# Patient Record
Sex: Female | Born: 1980 | Race: Black or African American | Hispanic: No | Marital: Married | Smoking: Never smoker
Health system: Southern US, Community
[De-identification: ages and names within clinical notes are randomized; demographics above are authoritative.]

## PROBLEM LIST (undated history)

## (undated) ENCOUNTER — Inpatient Hospital Stay (HOSPITAL_COMMUNITY): Payer: Self-pay

## (undated) DIAGNOSIS — Z789 Other specified health status: Secondary | ICD-10-CM

## (undated) DIAGNOSIS — R519 Headache, unspecified: Secondary | ICD-10-CM

## (undated) DIAGNOSIS — Z8619 Personal history of other infectious and parasitic diseases: Secondary | ICD-10-CM

## (undated) DIAGNOSIS — R51 Headache: Secondary | ICD-10-CM

## (undated) DIAGNOSIS — R42 Dizziness and giddiness: Secondary | ICD-10-CM

## (undated) DIAGNOSIS — R002 Palpitations: Secondary | ICD-10-CM

## (undated) DIAGNOSIS — D649 Anemia, unspecified: Secondary | ICD-10-CM

## (undated) HISTORY — DX: Personal history of other infectious and parasitic diseases: Z86.19

## (undated) HISTORY — DX: Dizziness and giddiness: R42

## (undated) HISTORY — PX: WISDOM TOOTH EXTRACTION: SHX21

## (undated) HISTORY — DX: Palpitations: R00.2

---

## 2008-03-11 ENCOUNTER — Ambulatory Visit (HOSPITAL_COMMUNITY): Admission: RE | Admit: 2008-03-11 | Discharge: 2008-03-11 | Payer: Self-pay | Admitting: Obstetrics and Gynecology

## 2008-04-01 ENCOUNTER — Ambulatory Visit (HOSPITAL_COMMUNITY): Admission: RE | Admit: 2008-04-01 | Discharge: 2008-04-01 | Payer: Self-pay | Admitting: Obstetrics and Gynecology

## 2008-05-27 ENCOUNTER — Ambulatory Visit (HOSPITAL_COMMUNITY): Admission: RE | Admit: 2008-05-27 | Discharge: 2008-05-27 | Payer: Self-pay | Admitting: Obstetrics and Gynecology

## 2008-06-17 ENCOUNTER — Ambulatory Visit (HOSPITAL_COMMUNITY): Admission: RE | Admit: 2008-06-17 | Discharge: 2008-06-17 | Payer: Self-pay | Admitting: Obstetrics and Gynecology

## 2008-06-21 ENCOUNTER — Inpatient Hospital Stay (HOSPITAL_COMMUNITY): Admission: AD | Admit: 2008-06-21 | Discharge: 2008-06-21 | Payer: Self-pay | Admitting: Obstetrics and Gynecology

## 2008-06-27 ENCOUNTER — Inpatient Hospital Stay (HOSPITAL_COMMUNITY): Admission: AD | Admit: 2008-06-27 | Discharge: 2008-06-29 | Payer: Self-pay | Admitting: Obstetrics and Gynecology

## 2008-07-22 ENCOUNTER — Ambulatory Visit (HOSPITAL_COMMUNITY): Admission: RE | Admit: 2008-07-22 | Discharge: 2008-07-22 | Payer: Self-pay | Admitting: Obstetrics and Gynecology

## 2008-08-12 ENCOUNTER — Inpatient Hospital Stay (HOSPITAL_COMMUNITY): Admission: RE | Admit: 2008-08-12 | Discharge: 2008-08-14 | Payer: Self-pay | Admitting: Obstetrics and Gynecology

## 2010-06-06 LAB — GC/CHLAMYDIA PROBE AMP, GENITAL
Chlamydia: NEGATIVE
Gonorrhea: NEGATIVE

## 2010-06-06 LAB — RPR: RPR: NONREACTIVE

## 2010-06-06 LAB — HIV ANTIBODY (ROUTINE TESTING W REFLEX): HIV: NONREACTIVE

## 2010-06-06 LAB — TSH: TSH: 0.075

## 2010-06-21 LAB — RUBELLA ANTIBODY, IGM: Rubella: IMMUNE

## 2010-08-05 IMAGING — US US OB FOLLOW-UP
1 series · 18 of 28 positions shown · non-contrast
Comparison: none

OBSTETRICAL ULTRASOUND:
 This ultrasound was performed in The [HOSPITAL], and the [facility] report will be stored to [REDACTED] PACS.

[Series 1: us ob follow-up · 64 acquisitions, 18 frames shown]
[im 1/64]
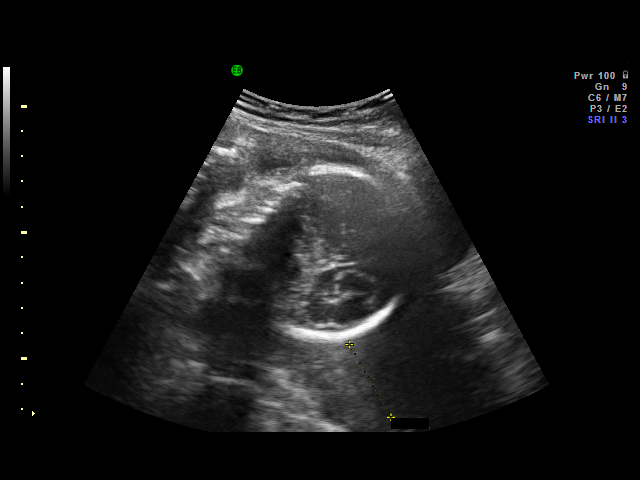
[im 5/64]
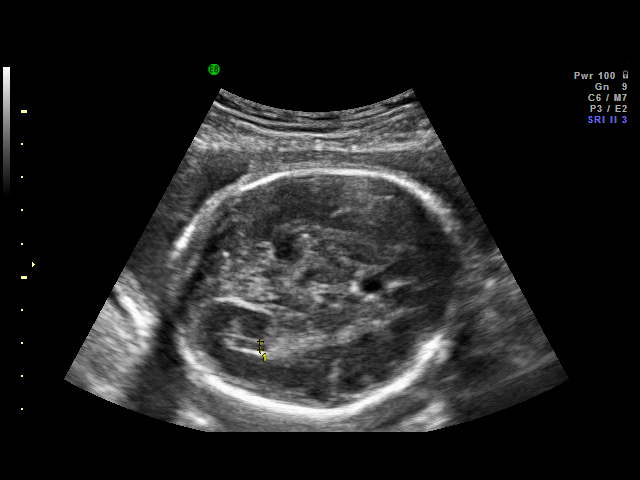
[im 8/64]
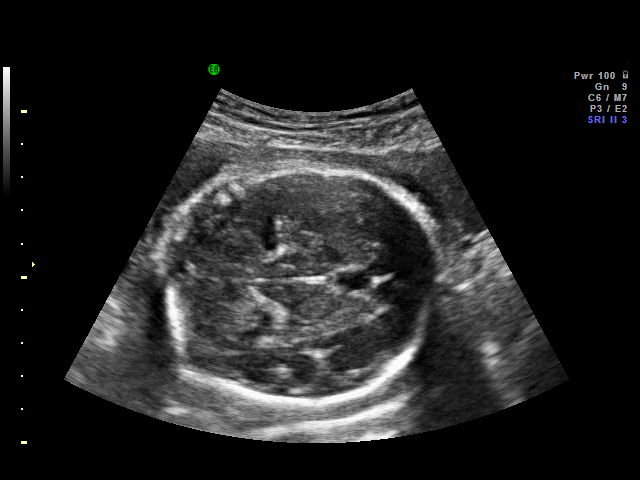
[im 12/64]
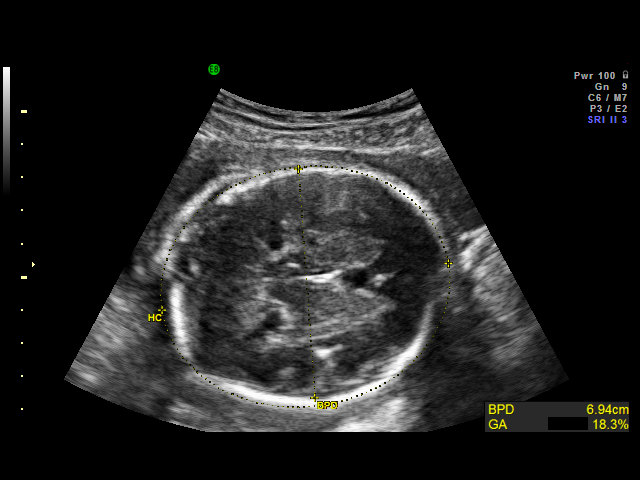
[im 17/64]
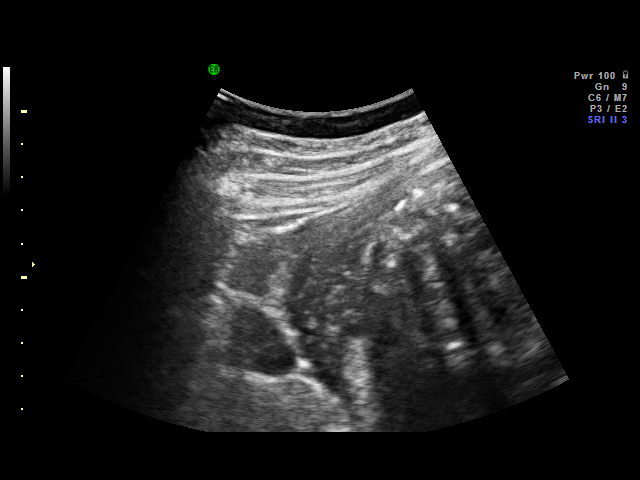
[im 19/64]
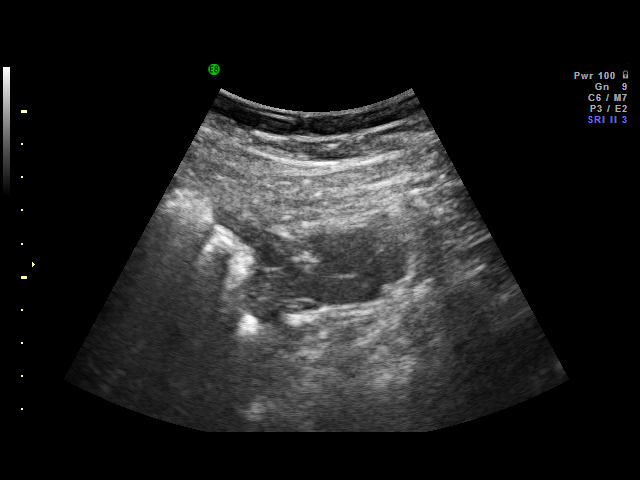
[im 24/64]
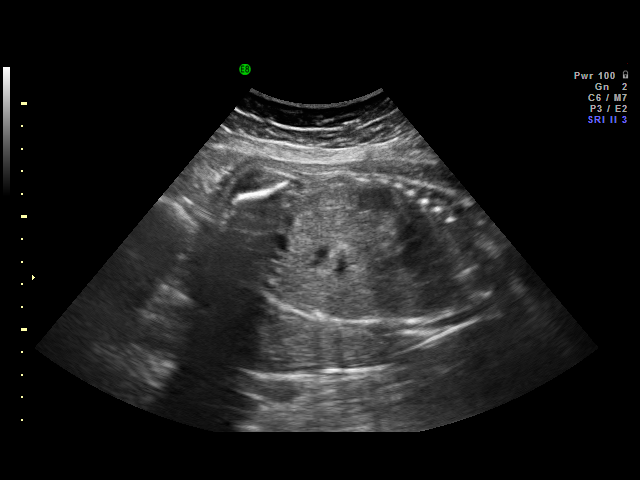
[im 26/64]
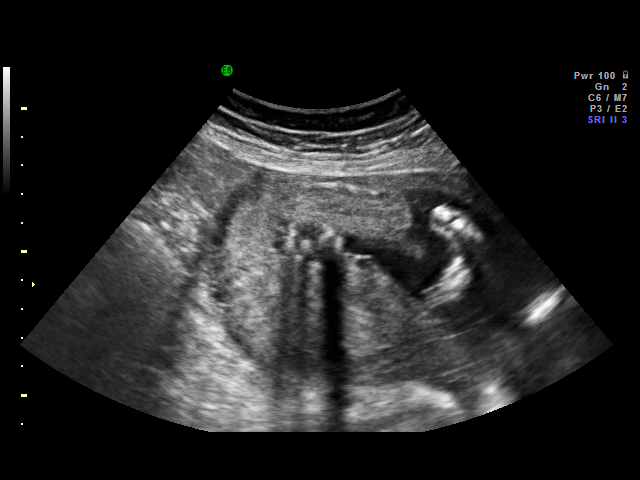
[im 31/64]
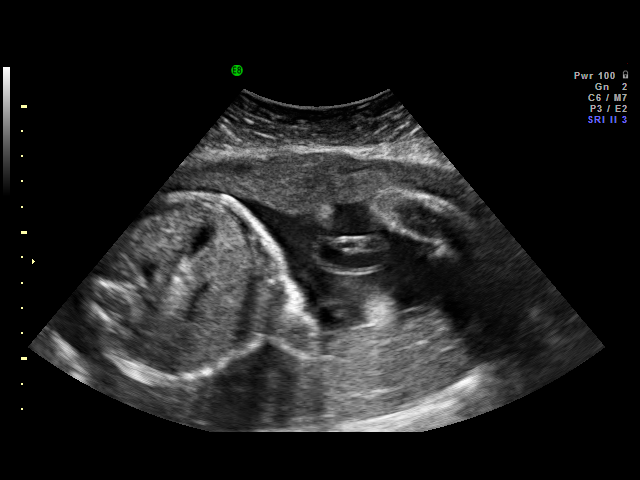
[im 33/64]
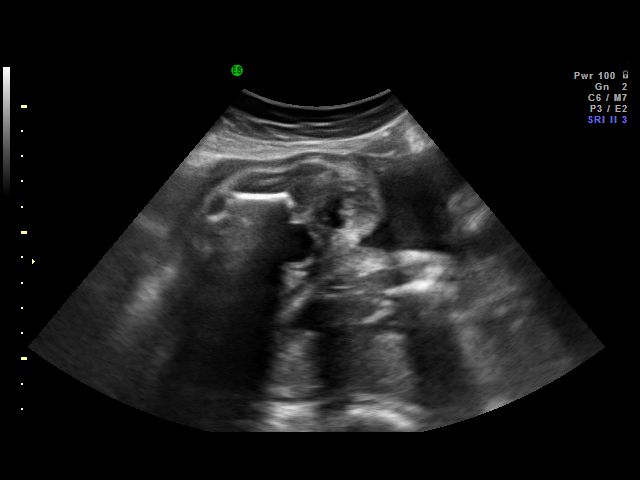
[im 38/64]
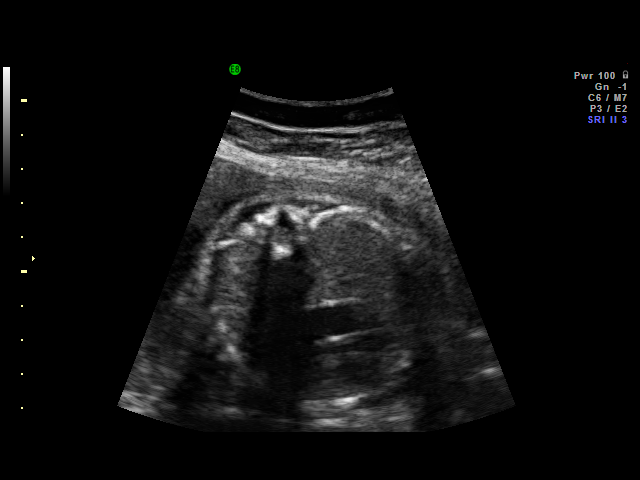
[im 40/64]
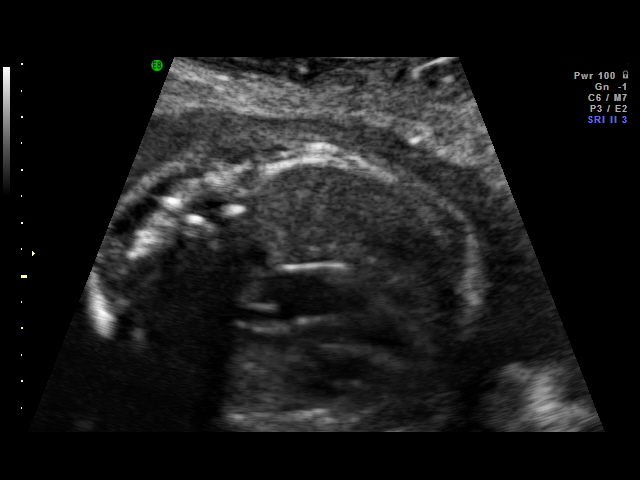
[im 45/64]
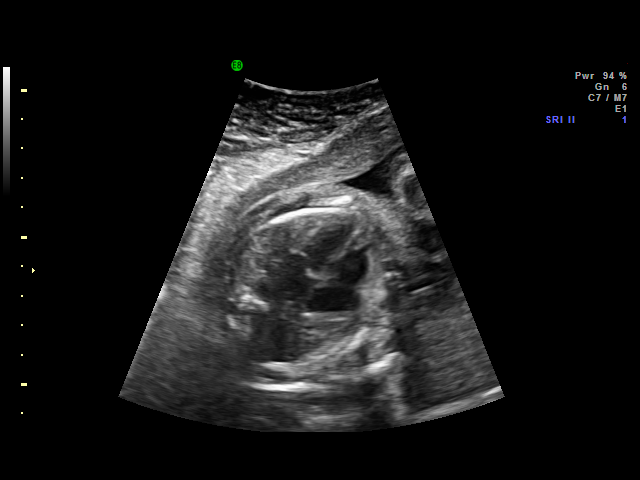
[im 50/64]
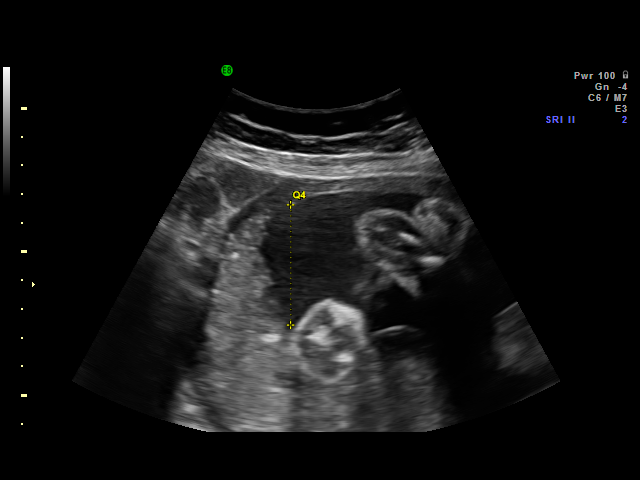
[im 52/64]
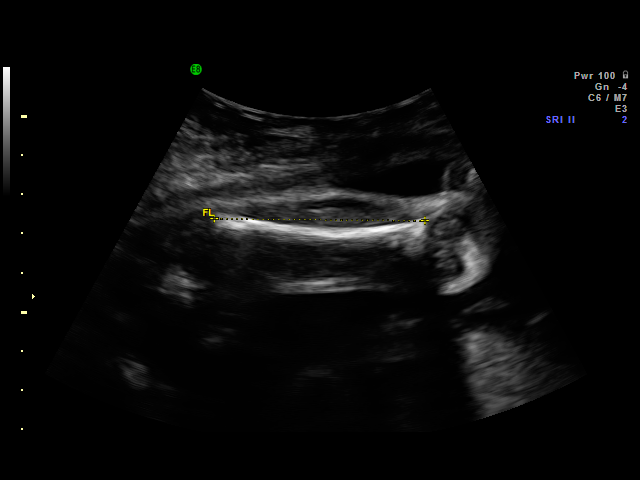
[im 57/64]
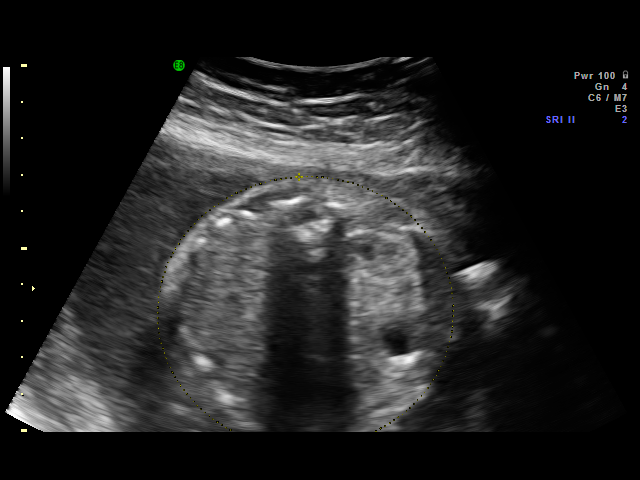
[im 59/64]
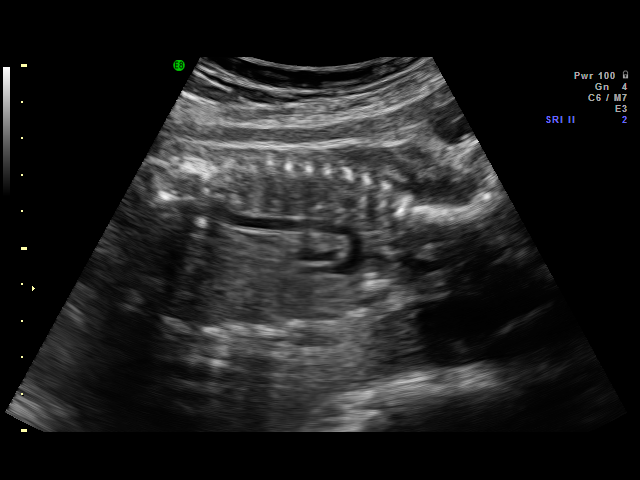
[im 64/64]
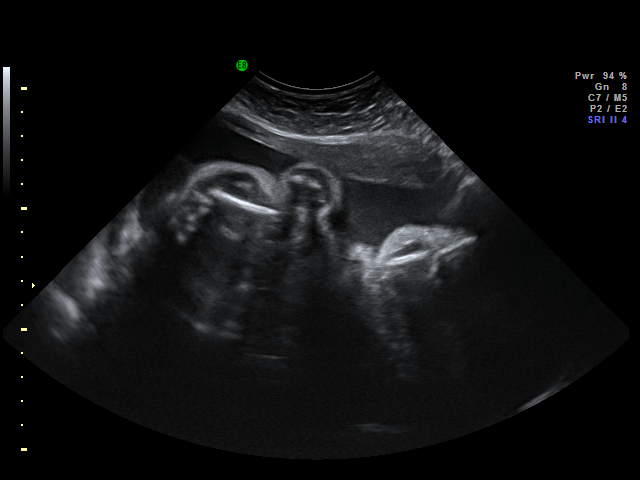

[18 of 28 positions shown; findings below may reference images not displayed]

IMPRESSION: [facility] has also been faxed to the ordering physician.

## 2010-08-08 LAB — CBC
HCT: 34 % — ABNORMAL LOW (ref 36.0–46.0)
HCT: 38.2 % (ref 36.0–46.0)
Hemoglobin: 11.6 g/dL — ABNORMAL LOW (ref 12.0–15.0)
Hemoglobin: 13.1 g/dL (ref 12.0–15.0)
MCHC: 34 g/dL (ref 30.0–36.0)
MCHC: 34.4 g/dL (ref 30.0–36.0)
MCV: 91.8 fL (ref 78.0–100.0)
MCV: 93.5 fL (ref 78.0–100.0)
Platelets: 182 10*3/uL (ref 150–400)
Platelets: 233 10*3/uL (ref 150–400)
RBC: 3.64 MIL/uL — ABNORMAL LOW (ref 3.87–5.11)
RBC: 4.16 MIL/uL (ref 3.87–5.11)
RDW: 12.9 % (ref 11.5–15.5)
RDW: 13.2 % (ref 11.5–15.5)
WBC: 10.2 10*3/uL (ref 4.0–10.5)
WBC: 8.9 10*3/uL (ref 4.0–10.5)

## 2010-08-08 LAB — RPR: RPR Ser Ql: NONREACTIVE

## 2010-08-09 LAB — CBC
HCT: 31.5 % — ABNORMAL LOW (ref 36.0–46.0)
HCT: 32.4 % — ABNORMAL LOW (ref 36.0–46.0)
Hemoglobin: 10.6 g/dL — ABNORMAL LOW (ref 12.0–15.0)
Hemoglobin: 10.8 g/dL — ABNORMAL LOW (ref 12.0–15.0)
MCHC: 33.2 g/dL (ref 30.0–36.0)
MCHC: 33.7 g/dL (ref 30.0–36.0)
MCV: 92 fL (ref 78.0–100.0)
MCV: 92.7 fL (ref 78.0–100.0)
Platelets: 192 10*3/uL (ref 150–400)
Platelets: 220 10*3/uL (ref 150–400)
RBC: 3.42 MIL/uL — ABNORMAL LOW (ref 3.87–5.11)
RBC: 3.49 MIL/uL — ABNORMAL LOW (ref 3.87–5.11)
RDW: 13 % (ref 11.5–15.5)
RDW: 13.3 % (ref 11.5–15.5)
WBC: 10.1 10*3/uL (ref 4.0–10.5)
WBC: 8 10*3/uL (ref 4.0–10.5)

## 2010-08-09 LAB — BASIC METABOLIC PANEL
BUN: 1 mg/dL — ABNORMAL LOW (ref 6–23)
CO2: 21 mEq/L (ref 19–32)
Calcium: 8.6 mg/dL (ref 8.4–10.5)
Chloride: 111 mEq/L (ref 96–112)
Creatinine, Ser: 0.43 mg/dL (ref 0.4–1.2)
GFR calc Af Amer: >60 mL/min (ref >60)
GFR calc non Af Amer: >60 mL/min (ref >60)
Glucose, Bld: 128 mg/dL — ABNORMAL HIGH (ref 70–99)
Potassium: 4.1 mEq/L (ref 3.5–5.1)
Sodium: 139 mEq/L (ref 135–145)

## 2010-08-09 LAB — TYPE AND SCREEN
ABO/RH(D): A POS
Antibody Screen: NEGATIVE

## 2010-08-09 LAB — URINE CULTURE
Colony Count: 2000
Special Requests: NEGATIVE

## 2010-08-09 LAB — COMPREHENSIVE METABOLIC PANEL
ALT: 16 U/L (ref 0–35)
AST: 22 U/L (ref 0–37)
Albumin: 2.3 g/dL — ABNORMAL LOW (ref 3.5–5.2)
Alkaline Phosphatase: 86 U/L (ref 39–117)
BUN: 1 mg/dL — ABNORMAL LOW (ref 6–23)
CO2: 25 mEq/L (ref 19–32)
Calcium: 8.4 mg/dL (ref 8.4–10.5)
Chloride: 106 mEq/L (ref 96–112)
Creatinine, Ser: 0.47 mg/dL (ref 0.4–1.2)
GFR calc Af Amer: >60 mL/min (ref >60)
GFR calc non Af Amer: >60 mL/min (ref >60)
Glucose, Bld: 108 mg/dL — ABNORMAL HIGH (ref 70–99)
Potassium: 2.5 mEq/L — CL (ref 3.5–5.1)
Sodium: 135 mEq/L (ref 135–145)
Total Bilirubin: 0.2 mg/dL — ABNORMAL LOW (ref 0.3–1.2)
Total Protein: 5.3 g/dL — ABNORMAL LOW (ref 6.0–8.3)

## 2010-08-09 LAB — WET PREP, GENITAL
Clue Cells Wet Prep HPF POC: NONE SEEN
Trich, Wet Prep: NONE SEEN
Yeast Wet Prep HPF POC: NONE SEEN

## 2010-08-09 LAB — URINALYSIS, ROUTINE W REFLEX MICROSCOPIC
Bilirubin Urine: NEGATIVE
Glucose, UA: 100 mg/dL — AB
Hgb urine dipstick: NEGATIVE
Ketones, ur: NEGATIVE mg/dL
Nitrite: NEGATIVE
Protein, ur: NEGATIVE mg/dL
Specific Gravity, Urine: 1.02 (ref 1.005–1.030)
Urobilinogen, UA: 0.2 mg/dL (ref 0.0–1.0)
pH: 6 (ref 5.0–8.0)

## 2010-08-09 LAB — MAGNESIUM: Magnesium: 5 mg/dL — ABNORMAL HIGH (ref 1.5–2.5)

## 2010-08-09 LAB — ABO/RH: ABO/RH(D): A POS

## 2010-08-09 LAB — POTASSIUM: Potassium: 3.5 mEq/L (ref 3.5–5.1)

## 2010-08-09 LAB — FETAL FIBRONECTIN: Fetal Fibronectin: POSITIVE

## 2010-08-14 LAB — COMPREHENSIVE METABOLIC PANEL
ALT: 11 U/L (ref 0–35)
AST: 15 U/L (ref 0–37)
Albumin: 2.8 g/dL — ABNORMAL LOW (ref 3.5–5.2)
Alkaline Phosphatase: 92 U/L (ref 39–117)
BUN: 6 mg/dL (ref 6–23)
CO2: 27 mEq/L (ref 19–32)
Calcium: 9.2 mg/dL (ref 8.4–10.5)
Chloride: 107 mEq/L (ref 96–112)
Creatinine, Ser: 0.44 mg/dL (ref 0.4–1.2)
GFR calc Af Amer: >60 mL/min (ref >60)
GFR calc non Af Amer: >60 mL/min (ref >60)
Glucose, Bld: 90 mg/dL (ref 70–99)
Potassium: 3.9 mEq/L (ref 3.5–5.1)
Sodium: 137 mEq/L (ref 135–145)
Total Bilirubin: 0.5 mg/dL (ref 0.3–1.2)
Total Protein: 6.3 g/dL (ref 6.0–8.3)

## 2010-08-14 LAB — CBC
HCT: 35.6 % — ABNORMAL LOW (ref 36.0–46.0)
Hemoglobin: 11.8 g/dL — ABNORMAL LOW (ref 12.0–15.0)
MCHC: 33.2 g/dL (ref 30.0–36.0)
MCV: 92.1 fL (ref 78.0–100.0)
Platelets: 221 10*3/uL (ref 150–400)
RBC: 3.86 MIL/uL — ABNORMAL LOW (ref 3.87–5.11)
RDW: 12.7 % (ref 11.5–15.5)
WBC: 8.9 10*3/uL (ref 4.0–10.5)

## 2010-08-14 LAB — URINALYSIS, ROUTINE W REFLEX MICROSCOPIC
Bilirubin Urine: NEGATIVE
Glucose, UA: NEGATIVE mg/dL
Hgb urine dipstick: NEGATIVE
Ketones, ur: NEGATIVE mg/dL
Nitrite: NEGATIVE
Protein, ur: NEGATIVE mg/dL
Specific Gravity, Urine: 1.015 (ref 1.005–1.030)
Urobilinogen, UA: 1 mg/dL (ref 0.0–1.0)
pH: 7 (ref 5.0–8.0)

## 2010-09-11 NOTE — Discharge Summary (Signed)
Jacqueline Salazar, Jacqueline Salazar             ACCOUNT NO.:  [id number]   MEDICAL RECORD NO.:  [id number]          PATIENT TYPE:  INP   LOCATION:  9151                          FACILITY:  [facility]   PHYSICIAN:  Pieter Partridge, MD   DATE OF BIRTH:  06-Nov-1980   DATE OF ADMISSION:  06/27/2008  DATE OF DISCHARGE:  06/29/2008                               DISCHARGE SUMMARY   ADMITTING DIAGNOSIS:  Pregnancy at 45 and 6/7th weeks with abdominal  pain.   DISCHARGE DIAGNOSIS:  Preterm labor and constipation.   PROCEDURES:  The patient received magnesium sulfate tocolysis and  betamethasone x2.   HISTORY OF PRESENT ILLNESS:  Jacqueline Salazar is a 30 year old G4, P2-0-1-2,  who presented at 56 and 6/7th weeks by the estimated due date of August 10, 2007, with the complaint of intermittent contractions.  She had  recently had gastroenteritis and was seen at [facility] the day  prior to admission.  She was told that she was contracting.  She  received 2 L of fluid at their Labor and Delivery Unit.  Her cervix was  closed when she was discharged from [place].  Upon arrival to the  office, the patient was complaining of contractions 4-6 an hour with the  baby pushing down.  Her cervical exam in the office was 1 cm thick and  high.   She was then sent over to MAU for monitoring and additional workup.  The  patient had a lot of irritability on the monitor but no contractions  were appreciated.  Her clinical presentation was consistent with labor,  she had significant pain.  She was breathing through her contractions, a  little diaphoretic, and complained of the baby pushing down.  An  ultrasound was done, which did not show an abruption.  A pelvic  ultrasound was done for wet prep and a fetal fibronectin.  Fetal  fibronectin was positive.  Wet prep was negative.  Ultrasound showed no  abruption.  Because the clinical picture and the cervical change from  close to 1 cm was noted, the patient was admitted for  observation.   I initially wanted to start Procardia; however, I saw the patient was  having contractions pretty regularly per history that magnesium sulfate  was necessary.  She was counseled on the side effects and the symptoms  of toxicity.   She got a 4-g IV load of magnesium, then 2 g every hour, betamethasone  injections were given and she was kept on bedrest.  On the date of  admission, she was able to sleep a little bit overnight with Ambien,  however, still was uncomfortable with contractions.   On hospital day #2, her contractions were down to 2 times an hour, with  decrease to 1.  Her contractions then increased when the magnesium was  increased to 2.5.  Her mag level after the initial low was 5.   Once, the second dose of betamethasone was in, the magnesium was  discontinued approximately 4-6 hours after that because she continued to  have contractions every 2-3 minutes and was quite comfortable.  At that  time, once the magnesium was discontinued, she was transferred to Labor  and Delivery for which we thought would be anticipated delivery.  The  patient did not deliver.  She continued to have contractions and  significant discomfort and pain but did not deliver.  The patient also  not had a bowel movement in 3-4 days.  She was given milk of magnesia  and Dulcolax p.o. with no relief.  On the day of discharge, her cervix  was checked and by my exam, she was 2 cm, 30%, -2; 12 hours previously,  the nurse had checked her and found her too thick and believe high.  So,  she had no cervical change.  She received a Dulcolax suppository and  after she had a bowel movement, she felt significantly better.  She was  not on any tocolytic overnight.  When the mag was discontinued on the  day of discharge, she was started on Procardia.  She responded very  well.  She did not have any other contractions.   Throughout her entire admission, her baby was very reassuring and  beautifully  reactive with prolonged accelerations.  She was given  clindamycin for GBS prophylaxis which was stopped on the day of  discharge.  CBC and CMP were done upon admission.  Her potassium was  low.  On admission, it was approximately 2.5.  She was given 40 mEq of K-  Dur q.6 h. x4 doses.  Her H and H at the time of discharge was, she was  still anemic; it was 10.8 hemoglobin and that was previously 10.6, WBCs  were 10.1.   DISCHARGE INFORMATION:  The patient was discharged home.  Activity is  bedrest and pelvic rest.  She will have TED hose to go home with.   MEDICATIONS:  She is to continue her prenatal vitamins and iron as  previously prescribed.  She was given a prescription for Procardia 10 mg  1 tablet p.o. q.6 h.  She was given preterm labor precautions.  Diet is  regular.  She has an appointment to follow up with [place] in 1 week.      Pieter Partridge, MD  Electronically Signed     EBV/MEDQ  D:  06/29/2008  T:  06/30/2008  Job:  [phone]

## 2010-10-04 LAB — CBC: Platelets: 251 10*3/uL (ref 150–399)

## 2010-10-04 LAB — HEPATITIS B SURFACE ANTIGEN: Hepatitis B Surface Ag: NEGATIVE

## 2010-10-04 LAB — HIV ANTIBODY (ROUTINE TESTING W REFLEX): HIV: NONREACTIVE

## 2010-12-05 LAB — STREP B DNA PROBE: GBS: NEGATIVE

## 2010-12-25 ENCOUNTER — Encounter (HOSPITAL_COMMUNITY): Payer: Self-pay | Admitting: *Deleted

## 2010-12-25 ENCOUNTER — Inpatient Hospital Stay (HOSPITAL_COMMUNITY)
Admission: AD | Admit: 2010-12-25 | Discharge: 2010-12-27 | DRG: 373 | Disposition: A | Discharge status: Home / Self Care | Payer: BC Managed Care – PPO | Source: Ambulatory Visit | Attending: Obstetrics and Gynecology | Admitting: Obstetrics and Gynecology

## 2010-12-25 HISTORY — DX: Other specified health status: Z78.9

## 2010-12-25 LAB — CBC
HCT: 34.9 % — ABNORMAL LOW (ref 36.0–46.0)
Hemoglobin: 12 g/dL (ref 12.0–15.0)
MCH: 30 pg (ref 26.0–34.0)
MCHC: 34.4 g/dL (ref 30.0–36.0)
MCV: 87.3 fL (ref 78.0–100.0)
Platelets: 233 10*3/uL (ref 150–400)
RBC: 4 MIL/uL (ref 3.87–5.11)
RDW: 12.8 % (ref 11.5–15.5)
WBC: 8.7 10*3/uL (ref 4.0–10.5)

## 2010-12-25 MED ORDER — LACTATED RINGERS IV SOLN: INTRAVENOUS | Status: DC

## 2010-12-25 MED ORDER — FLEET ENEMA 7-19 GM/118ML RE ENEM: 1.0000 | ENEMA | RECTAL | Status: DC | PRN

## 2010-12-25 MED ORDER — LIDOCAINE HCL (PF) 1 % IJ SOLN
30.0000 mL | INTRAMUSCULAR | Status: DC | PRN
  Filled 2010-12-25 (×2): qty 30

## 2010-12-25 MED ORDER — FENTANYL 2.5 MCG/ML BUPIVACAINE 1/10 % EPIDURAL INFUSION (WH - ANES)
14.0000 mL/h | INTRAMUSCULAR | Status: DC
  Administered 2010-12-26: 14 mL/h via EPIDURAL
  Filled 2010-12-25: qty 60

## 2010-12-25 MED ORDER — LACTATED RINGERS IV SOLN: 500.0000 mL | INTRAVENOUS | Status: DC | PRN

## 2010-12-25 MED ORDER — ACETAMINOPHEN 325 MG PO TABS: 650.0000 mg | ORAL_TABLET | ORAL | Status: DC | PRN

## 2010-12-25 MED ORDER — EPHEDRINE 5 MG/ML INJ
10.0000 mg | INTRAVENOUS | Status: DC | PRN
  Filled 2010-12-25: qty 4

## 2010-12-25 MED ORDER — CITRIC ACID-SODIUM CITRATE 334-500 MG/5ML PO SOLN: 30.0000 mL | ORAL | Status: DC | PRN

## 2010-12-25 MED ORDER — OXYTOCIN 20 UNITS IN LACTATED RINGERS INFUSION - SIMPLE: 125.0000 mL/h | INTRAVENOUS | Status: DC

## 2010-12-25 MED ORDER — FENTANYL 2.5 MCG/ML BUPIVACAINE 1/10 % EPIDURAL INFUSION (WH - ANES): 14.0000 mL/h | INTRAMUSCULAR | Status: DC

## 2010-12-25 MED ORDER — OXYCODONE-ACETAMINOPHEN 5-325 MG PO TABS: 2.0000 | ORAL_TABLET | ORAL | Status: DC | PRN

## 2010-12-25 MED ORDER — DIPHENHYDRAMINE HCL 50 MG/ML IJ SOLN: 12.5000 mg | INTRAMUSCULAR | Status: DC | PRN

## 2010-12-25 MED ORDER — LACTATED RINGERS IV SOLN
INTRAVENOUS | Status: DC
  Administered 2010-12-25: 125 mL/h via INTRAVENOUS

## 2010-12-25 MED ORDER — OXYTOCIN BOLUS FROM INFUSION
500.0000 mL | Freq: Once | INTRAVENOUS | Status: DC
  Filled 2010-12-25: qty 1000
  Filled 2010-12-25: qty 500

## 2010-12-25 MED ORDER — ONDANSETRON HCL 4 MG/2ML IJ SOLN: 4.0000 mg | Freq: Four times a day (QID) | INTRAMUSCULAR | Status: DC | PRN

## 2010-12-25 MED ORDER — OXYTOCIN BOLUS FROM INFUSION
500.0000 mL | Freq: Once | INTRAVENOUS | Status: DC
  Filled 2010-12-25: qty 500

## 2010-12-25 MED ORDER — IBUPROFEN 600 MG PO TABS: 600.0000 mg | ORAL_TABLET | Freq: Four times a day (QID) | ORAL | Status: DC | PRN

## 2010-12-25 MED ORDER — LIDOCAINE HCL (PF) 1 % IJ SOLN: 30.0000 mL | INTRAMUSCULAR | Status: DC | PRN

## 2010-12-25 MED ORDER — PHENYLEPHRINE 40 MCG/ML (10ML) SYRINGE FOR IV PUSH (FOR BLOOD PRESSURE SUPPORT)
80.0000 ug | PREFILLED_SYRINGE | INTRAVENOUS | Status: DC | PRN
  Filled 2010-12-25: qty 5

## 2010-12-25 MED ORDER — LACTATED RINGERS IV SOLN: 500.0000 mL | Freq: Once | INTRAVENOUS | Status: DC

## 2010-12-25 MED ORDER — EPHEDRINE 5 MG/ML INJ
10.0000 mg | INTRAVENOUS | Status: DC | PRN
  Filled 2010-12-25 (×2): qty 4

## 2010-12-25 MED ORDER — PHENYLEPHRINE 40 MCG/ML (10ML) SYRINGE FOR IV PUSH (FOR BLOOD PRESSURE SUPPORT): 80.0000 ug | PREFILLED_SYRINGE | INTRAVENOUS | Status: DC | PRN

## 2010-12-25 MED ORDER — PHENYLEPHRINE 40 MCG/ML (10ML) SYRINGE FOR IV PUSH (FOR BLOOD PRESSURE SUPPORT)
80.0000 ug | PREFILLED_SYRINGE | INTRAVENOUS | Status: DC | PRN
  Filled 2010-12-25 (×2): qty 5

## 2010-12-25 NOTE — Progress Notes (Signed)
Pt states ctx's began at 1445, no bleeding or lof, +FM, was 4cm/80% in office yesterday, was feeling a lot of rectal pressure, MD called, pt told to come to MAU for eval.

## 2010-12-25 NOTE — H&P (Signed)
Jacqueline Salazar is a 34 year all gravida 5 para 3 A1 female whose due date is 01/04/2011. She presents to MA U for regular contractions.  She denies rupture of membranes.Contractions are  every 2-4 minutes. She has had irregular contractions for the previous 2 days.   Past obstetric history is remarkable for 3 vaginal deliveries one miscarriage.   She is 38+ weeks pregnant on exam in the office on August 27 cervix was 480 and -1 membranes intact.   She is allergic to ampicillin She has no medical illnesses  No history of surgery  Review of systems is negative  Physical exam  vital signs are stable  HEENT exam is normal  S1 and S2 with clear Lungs are clear Abdomen bowel sounds present Term gravida Extremities normal without swelling discoloration Cervix 4-5, 80% effaced and -2 station, membranes intact  Assessment:  38+ week pregnancy, rule out labor  Plan:   expectant care Recheck cervix in 1-2 hours.

## 2010-12-25 NOTE — Progress Notes (Signed)
Pt states, " I started having contractions at 2:45 pm, and they are now they are every 5-8 mins. I was 4/80 in the office yesterday by Dr. Neva Seat."

## 2010-12-25 NOTE — ED Notes (Signed)
Reactive EFM tracing reviewed by JSpurlock-Frizzell, RN

## 2010-12-26 ENCOUNTER — Encounter (HOSPITAL_COMMUNITY): Payer: Self-pay | Admitting: Anesthesiology

## 2010-12-26 ENCOUNTER — Encounter (HOSPITAL_COMMUNITY): Payer: Self-pay | Admitting: *Deleted

## 2010-12-26 ENCOUNTER — Inpatient Hospital Stay (HOSPITAL_COMMUNITY): Payer: BC Managed Care – PPO | Admitting: Anesthesiology

## 2010-12-26 LAB — CBC
HCT: 36.3 % (ref 36.0–46.0)
Hemoglobin: 12 g/dL (ref 12.0–15.0)
MCH: 29.1 pg (ref 26.0–34.0)
MCHC: 33.1 g/dL (ref 30.0–36.0)
MCV: 88.1 fL (ref 78.0–100.0)
Platelets: 220 10*3/uL (ref 150–400)
RBC: 4.12 MIL/uL (ref 3.87–5.11)
RDW: 12.7 % (ref 11.5–15.5)
WBC: 12.7 10*3/uL — ABNORMAL HIGH (ref 4.0–10.5)

## 2010-12-26 LAB — COMPREHENSIVE METABOLIC PANEL
ALT: 6 U/L (ref 0–35)
AST: 16 U/L (ref 0–37)
Albumin: 2.6 g/dL — ABNORMAL LOW (ref 3.5–5.2)
Alkaline Phosphatase: 160 U/L — ABNORMAL HIGH (ref 39–117)
BUN: 3 mg/dL — ABNORMAL LOW (ref 6–23)
CO2: 26 mEq/L (ref 19–32)
Calcium: 9.6 mg/dL (ref 8.4–10.5)
Chloride: 105 mEq/L (ref 96–112)
Creatinine, Ser: <0.47 mg/dL — ABNORMAL LOW (ref 0.50–1.10)
Glucose, Bld: 88 mg/dL (ref 70–99)
Potassium: 4 mEq/L (ref 3.5–5.1)
Sodium: 136 mEq/L (ref 135–145)
Total Bilirubin: 0.8 mg/dL (ref 0.3–1.2)
Total Protein: 6.2 g/dL (ref 6.0–8.3)

## 2010-12-26 LAB — ABO/RH: RH Type: POSITIVE

## 2010-12-26 LAB — URIC ACID: Uric Acid, Serum: 5 mg/dL (ref 2.4–7.0)

## 2010-12-26 LAB — RPR: RPR Ser Ql: NONREACTIVE

## 2010-12-26 MED ORDER — DIBUCAINE 1 % RE OINT: 1.0000 "application " | TOPICAL_OINTMENT | RECTAL | Status: DC | PRN

## 2010-12-26 MED ORDER — ONDANSETRON HCL 4 MG/2ML IJ SOLN: 4.0000 mg | INTRAMUSCULAR | Status: DC | PRN

## 2010-12-26 MED ORDER — IBUPROFEN 600 MG PO TABS
600.0000 mg | ORAL_TABLET | Freq: Four times a day (QID) | ORAL | Status: DC
  Administered 2010-12-26 – 2010-12-27 (×5): 600 mg via ORAL
  Filled 2010-12-26 (×6): qty 1

## 2010-12-26 MED ORDER — SIMETHICONE 80 MG PO CHEW: 80.0000 mg | CHEWABLE_TABLET | ORAL | Status: DC | PRN

## 2010-12-26 MED ORDER — TETANUS-DIPHTH-ACELL PERTUSSIS 5-2.5-18.5 LF-MCG/0.5 IM SUSP
0.5000 mL | Freq: Once | INTRAMUSCULAR | Status: AC
  Administered 2010-12-27: 0.5 mL via INTRAMUSCULAR
  Filled 2010-12-26: qty 0.5

## 2010-12-26 MED ORDER — BENZOCAINE-MENTHOL 20-0.5 % EX AERO
1.0000 "application " | INHALATION_SPRAY | CUTANEOUS | Status: DC | PRN
  Administered 2010-12-26: 1 via TOPICAL

## 2010-12-26 MED ORDER — LANOLIN HYDROUS EX OINT: TOPICAL_OINTMENT | CUTANEOUS | Status: DC | PRN

## 2010-12-26 MED ORDER — TERBUTALINE SULFATE 1 MG/ML IJ SOLN: 0.2500 mg | Freq: Once | INTRAMUSCULAR | Status: DC | PRN

## 2010-12-26 MED ORDER — LIDOCAINE HCL 1.5 % IJ SOLN
INTRAMUSCULAR | Status: DC | PRN
  Administered 2010-12-26: 2 mL via EPIDURAL
  Administered 2010-12-26 (×2): 5 mL via EPIDURAL

## 2010-12-26 MED ORDER — PRENATAL PLUS 27-1 MG PO TABS
1.0000 | ORAL_TABLET | Freq: Every day | ORAL | Status: DC
  Administered 2010-12-26 – 2010-12-27 (×2): 1 via ORAL
  Filled 2010-12-26 (×2): qty 1

## 2010-12-26 MED ORDER — ZOLPIDEM TARTRATE 5 MG PO TABS: 5.0000 mg | ORAL_TABLET | Freq: Every evening | ORAL | Status: DC | PRN

## 2010-12-26 MED ORDER — MEDROXYPROGESTERONE ACETATE 150 MG/ML IM SUSP: 150.0000 mg | INTRAMUSCULAR | Status: DC | PRN

## 2010-12-26 MED ORDER — OXYTOCIN 20 UNITS IN LACTATED RINGERS INFUSION - SIMPLE
1.0000 m[IU]/min | INTRAVENOUS | Status: DC
  Administered 2010-12-26: 2 m[IU]/min via INTRAVENOUS
  Filled 2010-12-26: qty 1000

## 2010-12-26 MED ORDER — DIPHENHYDRAMINE HCL 25 MG PO CAPS: 25.0000 mg | ORAL_CAPSULE | Freq: Four times a day (QID) | ORAL | Status: DC | PRN

## 2010-12-26 MED ORDER — BENZOCAINE-MENTHOL 20-0.5 % EX AERO
INHALATION_SPRAY | CUTANEOUS | Status: AC
  Filled 2010-12-26: qty 56

## 2010-12-26 MED ORDER — OXYCODONE-ACETAMINOPHEN 5-325 MG PO TABS: 1.0000 | ORAL_TABLET | ORAL | Status: DC | PRN

## 2010-12-26 MED ORDER — MEASLES, MUMPS & RUBELLA VAC ~~LOC~~ INJ: 0.5000 mL | INJECTION | Freq: Once | SUBCUTANEOUS | Status: DC

## 2010-12-26 MED ORDER — CARBOPROST TROMETHAMINE 250 MCG/ML IM SOLN: 250.0000 ug | INTRAMUSCULAR | Status: DC | PRN

## 2010-12-26 MED ORDER — ONDANSETRON HCL 4 MG PO TABS: 4.0000 mg | ORAL_TABLET | ORAL | Status: DC | PRN

## 2010-12-26 MED ORDER — SENNOSIDES-DOCUSATE SODIUM 8.6-50 MG PO TABS
2.0000 | ORAL_TABLET | Freq: Every day | ORAL | Status: DC
  Administered 2010-12-26: 2 via ORAL

## 2010-12-26 MED ORDER — WITCH HAZEL-GLYCERIN EX PADS: 1.0000 "application " | MEDICATED_PAD | CUTANEOUS | Status: DC | PRN

## 2010-12-26 NOTE — Anesthesia Procedure Notes (Signed)
Epidural Patient location during procedure: OB Start time: 12/26/2010 4:45 AM Reason for block: procedure for pain  Staffing Performed by: anesthesiologist   Preanesthetic Checklist Completed: patient identified, site marked, surgical consent, pre-op evaluation, timeout performed, IV checked, risks and benefits discussed and monitors and equipment checked  Epidural Patient position: sitting Prep: site prepped and draped and DuraPrep Patient monitoring: continuous pulse ox and blood pressure Approach: midline Injection technique: LOR air  Needle:  Needle type: Tuohy  Needle gauge: 17 G Needle length: 9 cm Needle insertion depth: 5 cm cm Catheter type: closed end flexible Catheter size: 19 Gauge Catheter at skin depth: 10 cm Test dose: negative  Assessment Events: blood not aspirated, injection not painful, no injection resistance, negative IV test and no paresthesia

## 2010-12-26 NOTE — Anesthesia Preprocedure Evaluation (Signed)
Anesthesia Evaluation  Name, MR# and DOB Patient awake  General Assessment Comment  Reviewed: Allergy & Precautions, H&P , NPO status , Patient's Chart, lab work & pertinent test results, reviewed documented beta blocker date and time   History of Anesthesia Complications Negative for: history of anesthetic complications  Airway Mallampati: III TM Distance: >3 FB Neck ROM: full    Dental  (+) Teeth Intact   Pulmonary  clear to auscultation  breath sounds clear to auscultation none    Cardiovascular regular Normal    Neuro/Psych Negative Neurological ROS  Negative Psych ROS  GI/Hepatic/Renal negative GI ROS  negative Liver ROS  negative Renal ROS        Endo/Other  Negative Endocrine ROS (+)      Abdominal   Musculoskeletal   Hematology negative hematology ROS (+)   Peds  Reproductive/Obstetrics (+) Pregnancy    Anesthesia Other Findings             Anesthesia Physical Anesthesia Plan  ASA: II  Anesthesia Plan: Epidural   Post-op Pain Management:    Induction:   Airway Management Planned:   Additional Equipment:   Intra-op Plan:   Post-operative Plan:   Informed Consent: I have reviewed the patients History and Physical, chart, labs and discussed the procedure including the risks, benefits and alternatives for the proposed anesthesia with the patient or authorized representative who has indicated his/her understanding and acceptance.     Plan Discussed with:   Anesthesia Plan Comments:         Anesthesia Quick Evaluation

## 2010-12-26 NOTE — Progress Notes (Signed)
Delivery Note At  a viable and healthyfemale was delivered via NSVD.  (Presentation: vtx, LOA ;  ).  APGAR:9.9 , ; weight .   Placenta status: spontaneously dedliverd, 3 vessel cord no complications: .   Anesthesia:   Episiotomy: no Lacerations: none Suture Repair:  na Est. Blood Loss (mL): 500cc  Mom to postpartum.  Baby to nursery-stable.  Pippa Hanif E 12/26/2010, 5:27 AM

## 2010-12-26 NOTE — Progress Notes (Signed)
Jacqueline Salazar is a 30 y.o. [phone] at [redacted]w[redacted]d by ultrasound admitted for active labor  Subjective:  No complaints   Objective: BP 124/87  Pulse 82  Temp(Src) 98 F (36.7 C) (Oral)  Resp 20  Ht 5\' 3"  (1.6 m)  Wt 71.668 kg (158 lb)  BMI 27.99 kg/m2      FHT:  FHR: 150s bpm, variability: moderate,  accelerations:  Present,  decelerations:  Absent UC:   regular, every 4 minutes SVE:   Dilation: 6.5 Effacement (%): 80 Station: -2 Exam by:: Dr. Neva Seat  Labs: Lab Results  Component Value Date   WBC 8.7 12/25/2010   HGB 12.0 12/25/2010   HCT 34.9* 12/25/2010   MCV 87.3 12/25/2010   PLT 233 12/25/2010    Assessment / Plan: Augmentation of labor, progressing well  Labor: Progressing with pitocin augmerntation Fetal Wellbeing:  Category I Pain Control:  Labor support without medications Anticipated MOD:  NSVD  Jacqueline Salazar E 12/26/2010, 4:48 AM

## 2010-12-27 NOTE — Anesthesia Postprocedure Evaluation (Signed)
Anesthesia Post Note  Patient: Jacqueline Salazar  Procedure(s) Performed: CLE  Anesthesia type: Epidural  Patient location: Mother/Baby  Post pain: Pain level controlled  Post assessment: Post-op Vital signs reviewed and Patient's Cardiovascular Status Stable  Last Vitals:  Filed Vitals:   12/27/10 0548  BP: 102/68  Pulse: 82  Temp: 97.7 F (36.5 C)  Resp: 18    Post vital signs: Reviewed and stable  Level of consciousness: awake, alert  and oriented  Complications: No apparent anesthesia complications

## 2010-12-27 NOTE — Discharge Summary (Signed)
Obstetric Discharge Summary Reason for Admission: onset of labor Prenatal Procedures: none Intrapartum Procedures: spontaneous vaginal delivery Postpartum Procedures: none Complications-Operative and Postpartum: none Hemoglobin  Date Value Range Status  12/26/2010 12.0  12.0-15.0 (g/dL) Final     HCT  Date Value Range Status  12/26/2010 36.3  36.0-46.0 (%) Final    Discharge Diagnoses: Term Pregnancy-delivered  Discharge Information: Date: 12/27/2010 Activity: pelvic rest Diet: routine Medications: None Condition: stable Instructions: refer to practice specific booklet Discharge to: home   Newborn Data: Live born female  Birth Weight: 6 lb 4.2 oz (2840 g) APGAR: , 9  Home with mother.  Iyesha Such E 12/27/2010, 2:51 PM

## 2010-12-27 NOTE — Progress Notes (Signed)
Post Partum Day 1.5 Subjective: no complaints  Objective: Blood pressure 114/73, pulse 90, temperature 98.1 F (36.7 C), temperature source Oral, resp. rate 20, height 5\' 3"  (1.6 m), weight 71.668 kg (158 lb), unknown if currently breastfeeding.  Physical Exam:  General: alert Lochia: appropriate Uterine Fundus: firm Episiotomy, laceration : healing well DVT Evaluation: No evidence of DVT seen on physical exam.   Basename 12/26/10 0920 12/25/10 2040  HGB 12.0 12.0  HCT 36.3 34.9*    Assessment/Plan: Discharge home   LOS: 2 days   Jianna Drabik E 12/27/2010, 2:54 PM

## 2014-02-12 LAB — OB RESULTS CONSOLE GC/CHLAMYDIA
Chlamydia: NEGATIVE
Gonorrhea: NEGATIVE

## 2014-02-12 LAB — OB RESULTS CONSOLE ANTIBODY SCREEN: Antibody Screen: NEGATIVE

## 2014-02-12 LAB — OB RESULTS CONSOLE RUBELLA ANTIBODY, IGM: Rubella: IMMUNE

## 2014-02-12 LAB — OB RESULTS CONSOLE HEPATITIS B SURFACE ANTIGEN: Hepatitis B Surface Ag: NEGATIVE

## 2014-02-12 LAB — OB RESULTS CONSOLE ABO/RH: RH Type: POSITIVE

## 2014-02-12 LAB — OB RESULTS CONSOLE RPR: RPR: NONREACTIVE

## 2014-02-12 LAB — OB RESULTS CONSOLE HIV ANTIBODY (ROUTINE TESTING): HIV: NONREACTIVE

## 2014-02-23 ENCOUNTER — Other Ambulatory Visit (HOSPITAL_COMMUNITY)
Admission: RE | Admit: 2014-02-23 | Discharge: 2014-02-23 | Disposition: A | Discharge status: Home / Self Care | Payer: BC Managed Care – PPO | Source: Ambulatory Visit | Attending: Obstetrics and Gynecology | Admitting: Obstetrics and Gynecology

## 2014-02-23 ENCOUNTER — Other Ambulatory Visit: Payer: Self-pay | Admitting: Obstetrics and Gynecology

## 2014-02-23 DIAGNOSIS — Z1151 Encounter for screening for human papillomavirus (HPV): Secondary | ICD-10-CM | POA: Diagnosis present

## 2014-02-23 DIAGNOSIS — Z01419 Encounter for gynecological examination (general) (routine) without abnormal findings: Secondary | ICD-10-CM | POA: Diagnosis not present

## 2014-02-24 LAB — CYTOLOGY - PAP

## 2014-02-28 ENCOUNTER — Encounter (HOSPITAL_COMMUNITY): Payer: Self-pay | Admitting: *Deleted

## 2014-04-29 NOTE — L&D Delivery Note (Signed)
Delivery Note At 3:40 PM a viable female was delivered via Vaginal, Spontaneous Delivery (Presentation: ; Occiput Anterior).  APGAR: 8, 9; weight pending  .   Placenta status: Intact, Spontaneous.  Cord: 3 vessels with the following complications: None.  Cord pH: n/a  Anesthesia: Epidural  Episiotomy: None Lacerations: None Suture Repair: n/a Est. Blood Loss (mL): 15  Mom to postpartum.  Baby to Couplet care / Skin to Skin.  Couple desires outpatient circumcision.  Jacqueline Salazar, Jacqueline Salazar 09/08/2014, 4:37 PM

## 2014-05-19 ENCOUNTER — Encounter: Payer: Self-pay | Admitting: Internal Medicine

## 2014-05-19 ENCOUNTER — Ambulatory Visit (INDEPENDENT_AMBULATORY_CARE_PROVIDER_SITE_OTHER): Payer: BLUE CROSS/BLUE SHIELD | Admitting: Internal Medicine

## 2014-05-19 VITALS — BP 124/82 | HR 109 | Ht 63.0 in | Wt 162.0 lb

## 2014-05-19 DIAGNOSIS — R519 Headache, unspecified: Secondary | ICD-10-CM

## 2014-05-19 DIAGNOSIS — R002 Palpitations: Secondary | ICD-10-CM

## 2014-05-19 DIAGNOSIS — R51 Headache: Secondary | ICD-10-CM

## 2014-05-19 NOTE — Patient Instructions (Signed)
Your physician has recommended that you wear a 48 hour holter monitor. 48 hour Holter monitors are medical devices that record the heart's electrical activity. Doctors most often use these monitors to diagnose arrhythmias. Arrhythmias are problems with the speed or rhythm of the heartbeat. The monitor is a small, portable device. You can wear one while you do your normal daily activities. This is usually used to diagnose what is causing palpitations/syncope (passing out).  Your physician recommends that you schedule a follow-up appointment in: 3 weeks with Dr. Graciela HusbandsKlein. Per Dr. Graciela HusbandsKlein.

## 2014-05-19 NOTE — Progress Notes (Addendum)
Cardiology Office Note   Date:  05/19/2014   ID:  Jacqueline Salazar, DOB 08-Dec-1980, MRN [id number]  PCP:  No primary care provider on file.  Cardiologist:  Dr. Sherryl MangesSteven Klein ([facility])    Chief Complaint  Patient presents with  . Palpitations     History of Present Illness: Jacqueline Salazar is a 34 y.o. female who presents for evaluation of the above.   The patient is G5P4.  She is currently [redacted] weeks gestation.  She is referred by her gynecologist (Dr. Dion BodyVarnado).  Over the last several days she has been having unusual HAs as well as dizziness with assoc palpitations.  She mainly has symptoms of dizziness after eating.  She typically notes a spinning sensation.  She denies nausea.  She does experience diaphoresis.  She denies syncope.  She can elicit symptoms if she moves in a certain direction.  She also notes dizziness upon standing.  palpitations start suddenly.  HR feels very fast and they stop suddenly.  She denies chest pain.  She denies orthopnea, PND, edema.  Her pregnancy has been uncomplicated.  Her BP is borderline elevated today.  She started to notice higher BPs this past weekend.  TSH obtained by her OB last month was normal.     Studies/Reports Reviewed Today: None    Past Medical History  Diagnosis Date  . No pertinent past medical history   . Palpitations   . Light headedness     Past Surgical History  Procedure Laterality Date  . Wisdom tooth extraction       Current Outpatient Prescriptions  Medication Sig Dispense Refill  . acetaminophen (TYLENOL) 500 MG tablet Take 500-1,000 mg by mouth every 6 (six) hours as needed. For headache or pain     . ferrous sulfate 325 (65 FE) MG tablet Take 325 mg by mouth 2 (two) times daily.      . prenatal vitamin w/FE, FA (PRENATAL 1 + 1) 27-1 MG TABS Take 1 tablet by mouth daily.       No current facility-administered medications for this visit.    Allergies:   Ampicillin    Social History:  The patient  reports that  she has never smoked. She has never used smokeless tobacco. She reports that she does not drink alcohol or use illicit drugs.   Family History:  The patient's family history includes Asthma in her daughter; Diabetes in her maternal aunt; Hypertension in her mother; Stroke in her maternal grandfather. There is no history of Heart attack, Heart disease, or Heart failure.    ROS:  Please see the history of present illness.   Otherwise, review of systems are positive for none.   All other systems are reviewed and negative.    PHYSICAL EXAM: VS:  BP 124/82 mmHg  Pulse 109  Ht 5\' 3"  (1.6 m)  Wt 162 lb (73.483 kg)  BMI 28.70 kg/m2     Orthostatic VS for the past 24 hrs:  BP- Lying Pulse- Lying BP- Sitting Pulse- Sitting BP- Standing at 0 minutes Pulse- Standing at 0 minutes  05/19/14 1640 122/79 mmHg 95 132/82 mmHg 96 128/81 mmHg 97    Wt Readings from Last 3 Encounters:  12/25/10 158 lb (71.668 kg)     GEN: Well nourished, well developed, in no acute distress HEENT: normal Neck: no JVD,  No carotid bruits, no masses Cardiac:  Normal S1/S2,  RRR; no murmur  ,   no rubs or gallops, no edema  Respiratory:  clear to auscultation bilaterally, no wheezing, rhonchi or rales. GI: gravid abdomen MS: no deformity or atrophy Skin: warm and dry  Neuro:  CNs II-XII intact, Strength and sensation are intact Psych: Normal affect   EKG:  EKG is ordered today.  It demonstrates:   NSR, HR 92, normal axis, QTc 413 ms, no ST changes   Recent Labs: No results found for requested labs within last 365 days.  02/11/14:  Free T4 1.30 04/19/14:  TSH 0.74   Lipid Panel No results found for: CHOL, TRIG, HDL, CHOLHDL, VLDL, LDLCALC, LDLDIRECT    ASSESSMENT AND PLAN:  1.  Palpitations:  Patient also interviewed and examined by Dr. Sherryl Manges.  She has symptoms that sound c/w PSVT.      -  Arrange 48 hour Holter. 2.  Headaches:  The association of this with her palpitations is not clear and  likely not related.  FU with OB.   Current medicines are reviewed at length with the patient today.  The patient does not have concerns regarding medicines.  The following changes have been made:  no change  Labs/ tests ordered today include:  Orders Placed This Encounter  Procedures  . EKG 12-Lead  . Holter monitor - 48 hour    Disposition:   FU with Dr. Sherryl Manges  in 3 weeks.   Signed, Brynda Rim, MHS 05/19/2014 1:54 PM    [facility] [address] [place], [place], [place]  [address] Phone: [phone]; Fax: [phone]

## 2014-05-23 ENCOUNTER — Telehealth: Payer: Self-pay | Admitting: Internal Medicine

## 2014-05-23 NOTE — Telephone Encounter (Signed)
Called patient back. Patient will have the 48 hour Holter monitor appointment on May 30, 2014. Since, monitor appointment has changed, her appointment with Dr. Graciela HusbandsKlein would need to be changed to after the 48 hour period. Will forward to Norval MortonSherry Price RN for reschedule.

## 2014-05-23 NOTE — Telephone Encounter (Signed)
New Message  Pt had holter48 sched for 1/25 and a Graciela HusbandsKlein appt on 1/27 (exactly 48 hours).  Pt had to resch holter monitor appt for next week and assumed that she was to see Dr. Graciela HusbandsKlein 48 hours later, but he does not have an open appt until Feb. I resch her a Holter app for Feb 1, and the pt requested to speak w/ Rn concerning Graciela HusbandsKlein f/u. Please call back and discuss.

## 2014-05-25 ENCOUNTER — Ambulatory Visit: Payer: BLUE CROSS/BLUE SHIELD | Admitting: Internal Medicine

## 2014-05-30 ENCOUNTER — Encounter: Payer: Self-pay | Admitting: *Deleted

## 2014-05-30 ENCOUNTER — Encounter (INDEPENDENT_AMBULATORY_CARE_PROVIDER_SITE_OTHER): Payer: BLUE CROSS/BLUE SHIELD

## 2014-05-30 DIAGNOSIS — R002 Palpitations: Secondary | ICD-10-CM

## 2014-05-30 NOTE — Progress Notes (Signed)
Patient ID: Jacqueline Salazar, female   DOB: 11-15-1980, 34 y.o.   MRN: [id number] [employer] 48 hour holter monitor applied to patient.

## 2014-06-02 ENCOUNTER — Ambulatory Visit: Payer: BLUE CROSS/BLUE SHIELD | Admitting: Internal Medicine

## 2014-06-06 ENCOUNTER — Telehealth: Payer: Self-pay | Admitting: Internal Medicine

## 2014-06-06 NOTE — Telephone Encounter (Signed)
New problem    Pt want to know after Dr Graciela HusbandsKlein looked at her monitor results last week, does she need to come in the office. Please call pt.

## 2014-06-08 NOTE — Telephone Encounter (Signed)
Dr. Graciela HusbandsKlein reviewed monitor - findings: symptoms associated with NSR.  Informed patient of findings and gave her option to make f/u appt with Dr. Graciela HusbandsKlein or see him prn.  Patient will call office if she continues to experience issues of palpitations/dizziness and arrange visit if needed.  She states she will see us prn.

## 2014-06-17 ENCOUNTER — Ambulatory Visit: Payer: BLUE CROSS/BLUE SHIELD | Admitting: Internal Medicine

## 2014-08-24 ENCOUNTER — Inpatient Hospital Stay (HOSPITAL_COMMUNITY)
Admission: AD | Admit: 2014-08-24 | Discharge: 2014-08-24 | Disposition: A | Discharge status: Home / Self Care | Payer: BLUE CROSS/BLUE SHIELD | Source: Ambulatory Visit | Attending: Obstetrics and Gynecology | Admitting: Obstetrics and Gynecology

## 2014-08-24 ENCOUNTER — Encounter (HOSPITAL_COMMUNITY): Payer: Self-pay | Admitting: *Deleted

## 2014-08-24 DIAGNOSIS — Z3A36 36 weeks gestation of pregnancy: Secondary | ICD-10-CM | POA: Insufficient documentation

## 2014-08-24 HISTORY — DX: Anemia, unspecified: D64.9

## 2014-08-24 HISTORY — DX: Headache, unspecified: R51.9

## 2014-08-24 HISTORY — DX: Headache: R51

## 2014-08-24 LAB — OB RESULTS CONSOLE GBS: GBS: POSITIVE

## 2014-08-24 NOTE — MAU Note (Signed)
Contractions started around lunch time.no bleeding or leaking.  Sent from office, sve was 3/thick per Dr Dion BodyVarnado, was 1-2 on Friday.

## 2014-08-24 NOTE — MAU Note (Signed)
Contractions every 3-5 minutes on way to MAU Denies bright red vaginal bleeding.  Positive fetal movement Denies SROM/LOF  Denies any infections/complications of pregnancy  GBS culture taken in office today per patient

## 2014-08-24 NOTE — MAU Note (Signed)
An After Visit Summary was printed and given to the patient. Patient verbalizes understanding. Electronic signature pad malfunction.

## 2014-09-03 ENCOUNTER — Telehealth: Payer: Self-pay

## 2014-09-03 ENCOUNTER — Encounter (HOSPITAL_COMMUNITY): Payer: Self-pay

## 2014-09-03 ENCOUNTER — Inpatient Hospital Stay (HOSPITAL_COMMUNITY)
Admission: AD | Admit: 2014-09-03 | Discharge: 2014-09-03 | Disposition: A | Discharge status: Home / Self Care | Payer: BLUE CROSS/BLUE SHIELD | Source: Ambulatory Visit | Attending: Obstetrics and Gynecology | Admitting: Obstetrics and Gynecology

## 2014-09-03 DIAGNOSIS — Z3A38 38 weeks gestation of pregnancy: Secondary | ICD-10-CM | POA: Insufficient documentation

## 2014-09-03 LAB — URINALYSIS, ROUTINE W REFLEX MICROSCOPIC
Bilirubin Urine: NEGATIVE
Glucose, UA: NEGATIVE mg/dL
Hgb urine dipstick: NEGATIVE
Ketones, ur: 15 mg/dL — AB
Leukocytes, UA: NEGATIVE
Nitrite: NEGATIVE
Protein, ur: NEGATIVE mg/dL
Specific Gravity, Urine: 1.025 (ref 1.005–1.030)
Urobilinogen, UA: 1 mg/dL (ref 0.0–1.0)
pH: 6.5 (ref 5.0–8.0)

## 2014-09-03 NOTE — Telephone Encounter (Signed)
Patient call reporting contractions for the past 2 hours ranging from every 2-7 minutes.  Patient reports in last 30 minutes contractions have been consistently every 5-7 minutes.  Patient further reports that she was 3 cm two weeks ago when cervix was checked.  Patient reports GBS positive status and denies LOF and VB.  Patient reports good fetal movement and is unsure of pain mgmt course for labor.  Patient instructed to report to MAU for evaluation.

## 2014-09-03 NOTE — MAU Note (Signed)
Contractions since 5:15 pm. They are 5-7 min apart.  Denies bleeding, no leaking.  Baby moving well.

## 2014-09-03 NOTE — Discharge Instructions (Signed)
Third Trimester of Pregnancy The third trimester is from week 29 through week 42, months 7 through 9. This trimester is when your unborn baby (fetus) is growing very fast. At the end of the ninth month, the unborn baby is about 20 inches in length. It weighs about 6-10 pounds.  HOME CARE   Avoid all smoking, herbs, and alcohol. Avoid drugs not approved by your doctor.  Only take medicine as told by your doctor. Some medicines are safe and some are not during pregnancy.  Exercise only as told by your doctor. Stop exercising if you start having cramps.  Eat regular, healthy meals.  Wear a good support bra if your breasts are tender.  Do not use hot tubs, steam rooms, or saunas.  Wear your seat belt when driving.  Avoid raw meat, uncooked cheese, and liter boxes and soil used by cats.  Take your prenatal vitamins.  Try taking medicine that helps you poop (stool softener) as needed, and if your doctor approves. Eat more fiber by eating fresh fruit, vegetables, and whole grains. Drink enough fluids to keep your pee (urine) clear or pale yellow.  Take warm water baths (sitz baths) to soothe pain or discomfort caused by hemorrhoids. Use hemorrhoid cream if your doctor approves.  If you have puffy, bulging veins (varicose veins), wear support hose. Raise (elevate) your feet for 15 minutes, 3-4 times a day. Limit salt in your diet.  Avoid heavy lifting, wear low heels, and sit up straight.  Rest with your legs raised if you have leg cramps or low back pain.  Visit your dentist if you have not gone during your pregnancy. Use a soft toothbrush to brush your teeth. Be gentle when you floss.  You can have sex (intercourse) unless your doctor tells you not to.  Do not travel far distances unless you must. Only do so with your doctor's approval.  Take prenatal classes.  Practice driving to the hospital.  Pack your hospital bag.  Prepare the baby's room.  Go to your doctor visits. GET  HELP IF:  You are not sure if you are in labor or if your water has broken.  You are dizzy.  You have mild cramps or pressure in your lower belly (abdominal).  You have a nagging pain in your belly area.  You continue to feel sick to your stomach (nauseous), throw up (vomit), or have watery poop (diarrhea).  You have bad smelling fluid coming from your vagina.  You have pain with peeing (urination). GET HELP RIGHT AWAY IF:   You have a fever.  You are leaking fluid from your vagina.  You are spotting or bleeding from your vagina.  You have severe belly cramping or pain.  You lose or gain weight rapidly.  You have trouble catching your breath and have chest pain.  You notice sudden or extreme puffiness (swelling) of your face, hands, ankles, feet, or legs.  You have not felt the baby move in over an hour.  You have severe headaches that do not go away with medicine.  You have vision changes. Document Released: 07/10/2009 Document Revised: 08/10/2012 Document Reviewed: 06/16/2012 [facility] Patient Information 2015 [place], [place]. This information is not intended to replace advice given to you by your health care provider. Make sure you discuss any questions you have with your health care provider. Braxton Hicks Contractions Contractions of the uterus can occur throughout pregnancy. Contractions are not always a sign that you are in labor.  WHAT ARE  BRAXTON HICKS CONTRACTIONS?  Contractions that occur before labor are called Braxton Hicks contractions, or false labor. Toward the end of pregnancy (32-34 weeks), these contractions can develop more often and may become more forceful. This is not true labor because these contractions do not result in opening (dilatation) and thinning of the cervix. They are sometimes difficult to tell apart from true labor because these contractions can be forceful and people have different pain tolerances. You should not feel embarrassed if you go  to the hospital with false labor. Sometimes, the only way to tell if you are in true labor is for your health care provider to look for changes in the cervix. If there are no prenatal problems or other health problems associated with the pregnancy, it is completely safe to be sent home with false labor and await the onset of true labor. HOW CAN YOU TELL THE DIFFERENCE BETWEEN TRUE AND FALSE LABOR? False Labor  The contractions of false labor are usually shorter and not as hard as those of true labor.   The contractions are usually irregular.   The contractions are often felt in the front of the lower abdomen and in the groin.   The contractions may go away when you walk around or change positions while lying down.   The contractions get weaker and are shorter lasting as time goes on.   The contractions do not usually become progressively stronger, regular, and closer together as with true labor.  True Labor  Contractions in true labor last 30-70 seconds, become very regular, usually become more intense, and increase in frequency.   The contractions do not go away with walking.   The discomfort is usually felt in the top of the uterus and spreads to the lower abdomen and low back.   True labor can be determined by your health care provider with an exam. This will show that the cervix is dilating and getting thinner.  WHAT TO REMEMBER  Keep up with your usual exercises and follow other instructions given by your health care provider.   Take medicines as directed by your health care provider.   Keep your regular prenatal appointments.   Eat and drink lightly if you think you are going into labor.   If Braxton Hicks contractions are making you uncomfortable:   Change your position from lying down or resting to walking, or from walking to resting.   Sit and rest in a tub of warm water.   Drink 2-3 glasses of water. Dehydration may cause these contractions.   Do  slow and deep breathing several times an hour.  WHEN SHOULD I SEEK IMMEDIATE MEDICAL CARE? Seek immediate medical care if:  Your contractions become stronger, more regular, and closer together.   You have fluid leaking or gushing from your vagina.   You have a fever.   You pass blood-tinged mucus.   You have vaginal bleeding.   You have continuous abdominal pain.   You have low back pain that you never had before.   You feel your baby's head pushing down and causing pelvic pressure.   Your baby is not moving as much as it used to.  Document Released: 04/15/2005 Document Revised: 04/20/2013 Document Reviewed: 01/25/2013 [facility] Patient Information 2015 [place], [place]. This information is not intended to replace advice given to you by your health care provider. Make sure you discuss any questions you have with your health care provider. Fetal Movement Counts Patient Name: __________________________________________________ Patient Due Date: ____________________  Performing a fetal movement count is highly recommended in high-risk pregnancies, but it is good for every pregnant woman to do. Your health care provider may ask you to start counting fetal movements at 28 weeks of the pregnancy. Fetal movements often increase:  After eating a full meal.  After physical activity.  After eating or drinking something sweet or cold.  At rest. Pay attention to when you feel the baby is most active. This will help you notice a pattern of your baby's sleep and wake cycles and what factors contribute to an increase in fetal movement. It is important to perform a fetal movement count at the same time each day when your baby is normally most active.  HOW TO COUNT FETAL MOVEMENTS  Find a quiet and comfortable area to sit or lie down on your left side. Lying on your left side provides the best blood and oxygen circulation to your baby.  Write down the day and time on a sheet of paper or  in a journal.  Start counting kicks, flutters, swishes, rolls, or jabs in a 2-hour period. You should feel at least 10 movements within 2 hours.  If you do not feel 10 movements in 2 hours, wait 2-3 hours and count again. Look for a change in the pattern or not enough counts in 2 hours. SEEK MEDICAL CARE IF:  You feel less than 10 counts in 2 hours, tried twice.  There is no movement in over an hour.  The pattern is changing or taking longer each day to reach 10 counts in 2 hours.  You feel the baby is not moving as he or she usually does. Date: ____________ Movements: ____________ Start time: ____________ Doreatha MartinFinish time: ____________  Date: ____________ Movements: ____________ Start time: ____________ Doreatha MartinFinish time: ____________ Date: ____________ Movements: ____________ Start time: ____________ Doreatha MartinFinish time: ____________ Date: ____________ Movements: ____________ Start time: ____________ Doreatha MartinFinish time: ____________ Date: ____________ Movements: ____________ Start time: ____________ Doreatha MartinFinish time: ____________ Date: ____________ Movements: ____________ Start time: ____________ Doreatha MartinFinish time: ____________ Date: ____________ Movements: ____________ Start time: ____________ Doreatha MartinFinish time: ____________ Date: ____________ Movements: ____________ Start time: ____________ Doreatha MartinFinish time: ____________  Date: ____________ Movements: ____________ Start time: ____________ Doreatha MartinFinish time: ____________ Date: ____________ Movements: ____________ Start time: ____________ Doreatha MartinFinish time: ____________ Date: ____________ Movements: ____________ Start time: ____________ Doreatha MartinFinish time: ____________ Date: ____________ Movements: ____________ Start time: ____________ Doreatha MartinFinish time: ____________ Date: ____________ Movements: ____________ Start time: ____________ Doreatha MartinFinish time: ____________ Date: ____________ Movements: ____________ Start time: ____________ Doreatha MartinFinish time: ____________ Date: ____________ Movements: ____________ Start time:  ____________ Doreatha MartinFinish time: ____________  Date: ____________ Movements: ____________ Start time: ____________ Doreatha MartinFinish time: ____________ Date: ____________ Movements: ____________ Start time: ____________ Doreatha MartinFinish time: ____________ Date: ____________ Movements: ____________ Start time: ____________ Doreatha MartinFinish time: ____________ Date: ____________ Movements: ____________ Start time: ____________ Doreatha MartinFinish time: ____________ Date: ____________ Movements: ____________ Start time: ____________ Doreatha MartinFinish time: ____________ Date: ____________ Movements: ____________ Start time: ____________ Doreatha MartinFinish time: ____________ Date: ____________ Movements: ____________ Start time: ____________ Doreatha MartinFinish time: ____________  Date: ____________ Movements: ____________ Start time: ____________ Doreatha MartinFinish time: ____________ Date: ____________ Movements: ____________ Start time: ____________ Doreatha MartinFinish time: ____________ Date: ____________ Movements: ____________ Start time: ____________ Doreatha MartinFinish time: ____________ Date: ____________ Movements: ____________ Start time: ____________ Doreatha MartinFinish time: ____________ Date: ____________ Movements: ____________ Start time: ____________ Doreatha MartinFinish time: ____________ Date: ____________ Movements: ____________ Start time: ____________ Doreatha MartinFinish time: ____________ Date: ____________ Movements: ____________ Start time: ____________ Doreatha MartinFinish time: ____________  Date: ____________ Movements: ____________ Start time: ____________ Doreatha MartinFinish time: ____________ Date: ____________ Movements: ____________ Start time: ____________ Doreatha MartinFinish  time: ____________ Date: ____________ Movements: ____________ Start time: ____________ Doreatha MartinFinish time: ____________ Date: ____________ Movements: ____________ Start time: ____________ Doreatha MartinFinish time: ____________ Date: ____________ Movements: ____________ Start time: ____________ Doreatha MartinFinish time: ____________ Date: ____________ Movements: ____________ Start time: ____________ Doreatha MartinFinish time: ____________ Date:  ____________ Movements: ____________ Start time: ____________ Doreatha MartinFinish time: ____________  Date: ____________ Movements: ____________ Start time: ____________ Doreatha MartinFinish time: ____________ Date: ____________ Movements: ____________ Start time: ____________ Doreatha MartinFinish time: ____________ Date: ____________ Movements: ____________ Start time: ____________ Doreatha MartinFinish time: ____________ Date: ____________ Movements: ____________ Start time: ____________ Doreatha MartinFinish time: ____________ Date: ____________ Movements: ____________ Start time: ____________ Doreatha MartinFinish time: ____________ Date: ____________ Movements: ____________ Start time: ____________ Doreatha MartinFinish time: ____________ Date: ____________ Movements: ____________ Start time: ____________ Doreatha MartinFinish time: ____________  Date: ____________ Movements: ____________ Start time: ____________ Doreatha MartinFinish time: ____________ Date: ____________ Movements: ____________ Start time: ____________ Doreatha MartinFinish time: ____________ Date: ____________ Movements: ____________ Start time: ____________ Doreatha MartinFinish time: ____________ Date: ____________ Movements: ____________ Start time: ____________ Doreatha MartinFinish time: ____________ Date: ____________ Movements: ____________ Start time: ____________ Doreatha MartinFinish time: ____________ Date: ____________ Movements: ____________ Start time: ____________ Doreatha MartinFinish time: ____________ Date: ____________ Movements: ____________ Start time: ____________ Doreatha MartinFinish time: ____________  Date: ____________ Movements: ____________ Start time: ____________ Doreatha MartinFinish time: ____________ Date: ____________ Movements: ____________ Start time: ____________ Doreatha MartinFinish time: ____________ Date: ____________ Movements: ____________ Start time: ____________ Doreatha MartinFinish time: ____________ Date: ____________ Movements: ____________ Start time: ____________ Doreatha MartinFinish time: ____________ Date: ____________ Movements: ____________ Start time: ____________ Doreatha MartinFinish time: ____________ Date: ____________ Movements: ____________ Start  time: ____________ Doreatha MartinFinish time: ____________ Document Released: 05/15/2006 Document Revised: 08/30/2013 Document Reviewed: 02/10/2012 [employer] Patient Information 2015 [place]. This information is not intended to replace advice given to you by your health care provider. Make sure you discuss any questions you have with your health care provider.

## 2014-09-06 ENCOUNTER — Inpatient Hospital Stay (HOSPITAL_COMMUNITY)
Admission: AD | Admit: 2014-09-06 | Discharge: 2014-09-06 | Disposition: A | Discharge status: Home / Self Care | Payer: BLUE CROSS/BLUE SHIELD | Source: Ambulatory Visit | Attending: Obstetrics & Gynecology | Admitting: Obstetrics & Gynecology

## 2014-09-06 ENCOUNTER — Encounter (HOSPITAL_COMMUNITY): Payer: Self-pay | Admitting: *Deleted

## 2014-09-06 DIAGNOSIS — Z3A38 38 weeks gestation of pregnancy: Secondary | ICD-10-CM | POA: Diagnosis not present

## 2014-09-06 DIAGNOSIS — O9989 Other specified diseases and conditions complicating pregnancy, childbirth and the puerperium: Secondary | ICD-10-CM | POA: Diagnosis not present

## 2014-09-06 DIAGNOSIS — R102 Pelvic and perineal pain: Secondary | ICD-10-CM | POA: Insufficient documentation

## 2014-09-06 NOTE — MAU Note (Signed)
Having a lot of mucous discharge.  Feeling an increase in pelvic pressure, sometimes is painful, comes and goes.

## 2014-09-06 NOTE — Progress Notes (Signed)
Called Dr Charlotta Newtonzan informed of sve, fhr tracing, uc pattern, pt has induction on 5/12. Pt can be discharged home.

## 2014-09-07 ENCOUNTER — Telehealth (HOSPITAL_COMMUNITY): Payer: Self-pay | Admitting: *Deleted

## 2014-09-07 ENCOUNTER — Other Ambulatory Visit: Payer: Self-pay | Admitting: Obstetrics and Gynecology

## 2014-09-07 ENCOUNTER — Encounter (HOSPITAL_COMMUNITY): Payer: Self-pay | Admitting: *Deleted

## 2014-09-07 NOTE — Telephone Encounter (Signed)
Preadmission screen  

## 2014-09-08 ENCOUNTER — Inpatient Hospital Stay (HOSPITAL_COMMUNITY): Payer: BLUE CROSS/BLUE SHIELD | Admitting: Anesthesiology

## 2014-09-08 ENCOUNTER — Encounter (HOSPITAL_COMMUNITY): Payer: Self-pay

## 2014-09-08 ENCOUNTER — Inpatient Hospital Stay (HOSPITAL_COMMUNITY)
Admission: RE | Admit: 2014-09-08 | Discharge: 2014-09-10 | DRG: 775 | Disposition: A | Discharge status: Home / Self Care | Payer: BLUE CROSS/BLUE SHIELD | Source: Ambulatory Visit | Attending: Obstetrics and Gynecology | Admitting: Obstetrics and Gynecology

## 2014-09-08 DIAGNOSIS — Z3A39 39 weeks gestation of pregnancy: Secondary | ICD-10-CM | POA: Diagnosis present

## 2014-09-08 DIAGNOSIS — Z3483 Encounter for supervision of other normal pregnancy, third trimester: Secondary | ICD-10-CM | POA: Diagnosis present

## 2014-09-08 DIAGNOSIS — O99824 Streptococcus B carrier state complicating childbirth: Secondary | ICD-10-CM | POA: Diagnosis not present

## 2014-09-08 DIAGNOSIS — Z3490 Encounter for supervision of normal pregnancy, unspecified, unspecified trimester: Secondary | ICD-10-CM

## 2014-09-08 LAB — CBC
HCT: 36.4 % (ref 36.0–46.0)
Hemoglobin: 12.4 g/dL (ref 12.0–15.0)
MCH: 29.3 pg (ref 26.0–34.0)
MCHC: 34.1 g/dL (ref 30.0–36.0)
MCV: 86.1 fL (ref 78.0–100.0)
Platelets: 245 10*3/uL (ref 150–400)
RBC: 4.23 MIL/uL (ref 3.87–5.11)
RDW: 13.2 % (ref 11.5–15.5)
WBC: 8.3 10*3/uL (ref 4.0–10.5)

## 2014-09-08 LAB — TYPE AND SCREEN
ABO/RH(D): A POS
Antibody Screen: NEGATIVE

## 2014-09-08 MED ORDER — LACTATED RINGERS IV SOLN: 500.0000 mL | INTRAVENOUS | Status: DC | PRN

## 2014-09-08 MED ORDER — CITRIC ACID-SODIUM CITRATE 334-500 MG/5ML PO SOLN: 30.0000 mL | ORAL | Status: DC | PRN

## 2014-09-08 MED ORDER — ONDANSETRON HCL 4 MG/2ML IJ SOLN: 4.0000 mg | Freq: Four times a day (QID) | INTRAMUSCULAR | Status: DC | PRN

## 2014-09-08 MED ORDER — SIMETHICONE 80 MG PO CHEW: 80.0000 mg | CHEWABLE_TABLET | ORAL | Status: DC | PRN

## 2014-09-08 MED ORDER — TERBUTALINE SULFATE 1 MG/ML IJ SOLN
0.2500 mg | Freq: Once | INTRAMUSCULAR | Status: DC | PRN
  Filled 2014-09-08: qty 1

## 2014-09-08 MED ORDER — ACETAMINOPHEN 325 MG PO TABS
650.0000 mg | ORAL_TABLET | ORAL | Status: DC | PRN
Start: 2014-09-08 — End: 2014-09-10

## 2014-09-08 MED ORDER — OXYCODONE-ACETAMINOPHEN 5-325 MG PO TABS: 2.0000 | ORAL_TABLET | ORAL | Status: DC | PRN

## 2014-09-08 MED ORDER — ONDANSETRON HCL 4 MG/2ML IJ SOLN: 4.0000 mg | INTRAMUSCULAR | Status: DC | PRN

## 2014-09-08 MED ORDER — TETANUS-DIPHTH-ACELL PERTUSSIS 5-2.5-18.5 LF-MCG/0.5 IM SUSP: 0.5000 mL | Freq: Once | INTRAMUSCULAR | Status: DC

## 2014-09-08 MED ORDER — OXYCODONE-ACETAMINOPHEN 5-325 MG PO TABS: 1.0000 | ORAL_TABLET | ORAL | Status: DC | PRN

## 2014-09-08 MED ORDER — MAGNESIUM HYDROXIDE 400 MG/5ML PO SUSP: 30.0000 mL | ORAL | Status: DC | PRN

## 2014-09-08 MED ORDER — METHYLERGONOVINE MALEATE 0.2 MG/ML IJ SOLN: 0.2000 mg | INTRAMUSCULAR | Status: DC | PRN

## 2014-09-08 MED ORDER — DIPHENHYDRAMINE HCL 25 MG PO CAPS: 25.0000 mg | ORAL_CAPSULE | Freq: Four times a day (QID) | ORAL | Status: DC | PRN

## 2014-09-08 MED ORDER — IBUPROFEN 600 MG PO TABS
600.0000 mg | ORAL_TABLET | Freq: Four times a day (QID) | ORAL | Status: DC
  Administered 2014-09-09 – 2014-09-10 (×6): 600 mg via ORAL
  Filled 2014-09-08 (×6): qty 1

## 2014-09-08 MED ORDER — FENTANYL 2.5 MCG/ML BUPIVACAINE 1/10 % EPIDURAL INFUSION (WH - ANES)
14.0000 mL/h | INTRAMUSCULAR | Status: DC | PRN
  Administered 2014-09-08: 14 mL/h via EPIDURAL
  Filled 2014-09-08: qty 125

## 2014-09-08 MED ORDER — OXYTOCIN BOLUS FROM INFUSION: 500.0000 mL | INTRAVENOUS | Status: DC

## 2014-09-08 MED ORDER — PHENYLEPHRINE 40 MCG/ML (10ML) SYRINGE FOR IV PUSH (FOR BLOOD PRESSURE SUPPORT)
80.0000 ug | PREFILLED_SYRINGE | INTRAVENOUS | Status: DC | PRN
  Filled 2014-09-08: qty 2
  Filled 2014-09-08: qty 20

## 2014-09-08 MED ORDER — ZOLPIDEM TARTRATE 5 MG PO TABS: 5.0000 mg | ORAL_TABLET | Freq: Every evening | ORAL | Status: DC | PRN

## 2014-09-08 MED ORDER — OXYTOCIN 40 UNITS IN LACTATED RINGERS INFUSION - SIMPLE MED: 62.5000 mL/h | INTRAVENOUS | Status: DC | PRN

## 2014-09-08 MED ORDER — WITCH HAZEL-GLYCERIN EX PADS: 1.0000 "application " | MEDICATED_PAD | CUTANEOUS | Status: DC | PRN

## 2014-09-08 MED ORDER — DIPHENHYDRAMINE HCL 50 MG/ML IJ SOLN: 12.5000 mg | INTRAMUSCULAR | Status: DC | PRN

## 2014-09-08 MED ORDER — SENNOSIDES-DOCUSATE SODIUM 8.6-50 MG PO TABS
2.0000 | ORAL_TABLET | ORAL | Status: DC
  Administered 2014-09-09 (×2): 2 via ORAL
  Filled 2014-09-08 (×2): qty 2

## 2014-09-08 MED ORDER — DIBUCAINE 1 % RE OINT: 1.0000 "application " | TOPICAL_OINTMENT | RECTAL | Status: DC | PRN

## 2014-09-08 MED ORDER — CLINDAMYCIN PHOSPHATE 900 MG/50ML IV SOLN
900.0000 mg | Freq: Three times a day (TID) | INTRAVENOUS | Status: DC
  Administered 2014-09-08: 900 mg via INTRAVENOUS
  Filled 2014-09-08 (×3): qty 50

## 2014-09-08 MED ORDER — BENZOCAINE-MENTHOL 20-0.5 % EX AERO: 1.0000 "application " | INHALATION_SPRAY | CUTANEOUS | Status: DC | PRN

## 2014-09-08 MED ORDER — OXYTOCIN 40 UNITS IN LACTATED RINGERS INFUSION - SIMPLE MED: 62.5000 mL/h | INTRAVENOUS | Status: DC

## 2014-09-08 MED ORDER — METHYLERGONOVINE MALEATE 0.2 MG PO TABS: 0.2000 mg | ORAL_TABLET | ORAL | Status: DC | PRN

## 2014-09-08 MED ORDER — LIDOCAINE HCL (PF) 1 % IJ SOLN
INTRAMUSCULAR | Status: DC | PRN
  Administered 2014-09-08 (×2): 4 mL

## 2014-09-08 MED ORDER — EPHEDRINE 5 MG/ML INJ
10.0000 mg | INTRAVENOUS | Status: DC | PRN
  Filled 2014-09-08: qty 2

## 2014-09-08 MED ORDER — LANOLIN HYDROUS EX OINT: TOPICAL_OINTMENT | CUTANEOUS | Status: DC | PRN

## 2014-09-08 MED ORDER — LIDOCAINE HCL (PF) 1 % IJ SOLN
30.0000 mL | INTRAMUSCULAR | Status: DC | PRN
  Filled 2014-09-08: qty 30

## 2014-09-08 MED ORDER — LACTATED RINGERS IV SOLN
INTRAVENOUS | Status: DC
  Administered 2014-09-08 (×2): via INTRAVENOUS

## 2014-09-08 MED ORDER — FENTANYL CITRATE (PF) 100 MCG/2ML IJ SOLN: 50.0000 ug | INTRAMUSCULAR | Status: DC | PRN

## 2014-09-08 MED ORDER — ACETAMINOPHEN 325 MG PO TABS: 650.0000 mg | ORAL_TABLET | ORAL | Status: DC | PRN

## 2014-09-08 MED ORDER — OXYTOCIN 40 UNITS IN LACTATED RINGERS INFUSION - SIMPLE MED
1.0000 m[IU]/min | INTRAVENOUS | Status: DC
  Administered 2014-09-08: 1 m[IU]/min via INTRAVENOUS
  Filled 2014-09-08: qty 1000

## 2014-09-08 MED ORDER — PRENATAL MULTIVITAMIN CH
1.0000 | ORAL_TABLET | Freq: Every day | ORAL | Status: DC
  Administered 2014-09-09 (×2): 1 via ORAL
  Filled 2014-09-08: qty 1

## 2014-09-08 MED ORDER — ONDANSETRON HCL 4 MG PO TABS: 4.0000 mg | ORAL_TABLET | ORAL | Status: DC | PRN

## 2014-09-08 NOTE — H&P (Signed)
Jacqueline Salazar is a 34 y.o. female G6 P4014 @ 39 0/7 weeks c/w an 11 week ultrasound presenting for elective IOL.  Pt has had preterm contractions for several weeks.  Pt lives ~ 20 minutes from hospital.  Due to husband's work schedule and location, pt desires induction in the event of precipitous delivery.  Pt has had intermittent contractions for the last 2 weeks.  Cervix has changed from ~2 to 4 cm.  Prenatal care complicated as below.  Pt has [facility]  She is GBS+, sensitive to Clindamycin.  Maternal Medical History:  Reason for admission: IOL  Contractions: Onset was 1 week ago.   Frequency: irregular.   Perceived severity is moderate.    Fetal activity: Perceived fetal activity is normal.    Prenatal complications: Postprandial vagal response.  S/p cardiology evaluation. Wore Holter monitor.  W/u negative. N/v of pregnancy.  Prenatal Complications - Diabetes: none.    OB History    Gravida Para Term Preterm AB TAB SAB Ectopic Multiple Living   6 4 4  1  1   4      Past Medical History  Diagnosis Date  . No pertinent past medical history   . Palpitations   . Light headedness   . Anemia   . Headache   . Hx of varicella    Past Surgical History  Procedure Laterality Date  . Wisdom tooth extraction     Family History: family history includes Asthma in her daughter; Diabetes in her maternal aunt; Hypertension in her mother; Stroke in her maternal grandfather. There is no history of Heart attack, Heart disease, or Heart failure. Social History:  reports that she has never smoked. She has never used smokeless tobacco. She reports that she does not drink alcohol or use illicit drugs.   Prenatal Transfer Tool  Maternal Diabetes: No Genetic Screening: Normal Maternal Ultrasounds/Referrals: Normal Fetal Ultrasounds or other Referrals:  None Maternal Substance Abuse:  No Significant Maternal Medications:  None Significant  Maternal Lab Results:  Lab values include: Group B Strep positive Other Comments:  None  Review of Systems  Constitutional: Negative for fever and chills.  Gastrointestinal: Negative for abdominal pain.    Dilation: 4 Effacement (%): 50 Station: -2 Exam by:: dr Dion Bodyvarnado Blood pressure 123/83, pulse 99, resp. rate 18, unknown if currently breastfeeding. Maternal Exam:  Abdomen: Patient reports no abdominal tenderness. Estimated fetal weight is 6.5-7 lbs.   Fetal presentation: vertex  Introitus: Normal vulva. Ferning test: not done.  Amniotic fluid character: not assessed.  Pelvis: adequate for delivery.   Cervix: Cervix evaluated by digital exam.     Fetal Exam Fetal Monitor Review: Mode: fetoscope.   Pattern: accelerations present.    Fetal State Assessment: Category I - tracings are normal.     Physical Exam  Constitutional: She is oriented to person, place, and time. She appears well-developed and well-nourished. No distress.  HENT:  Head: Normocephalic and atraumatic.  Eyes: EOM are normal.  Neck: Normal range of motion.  Respiratory: Effort normal and breath sounds normal. No respiratory distress.  GI: There is no tenderness.  Musculoskeletal: Normal range of motion. She exhibits edema. She exhibits no tenderness.  Neurological: She is alert and oriented to person, place, and time.  Skin: Skin is warm and dry.  Psychiatric: She has a normal mood and affect.    Prenatal labs: ABO, Rh: A/Positive/-- (10/17 0000) Antibody: Negative (10/17 0000) Rubella: Immune (10/17 0000) RPR:  Nonreactive (10/17 0000)  HBsAg: Negative (10/17 0000)  HIV: Non-reactive (10/17 0000)  GBS: Positive (04/27 0000)   Assessment/Plan: IUP at 39 0/7 weeks GBS+.  Ampicillin allergy.  Sensitive to Clindamycin.  H/o postprandial vagal response this pregnancy.  S/p cardiology w/u-negative.  Start Pitocin. Clindamycin per protocol. Await SROM.  Geryl RankinsVARNADO, Louden Houseworth 09/08/2014, 6:59  AM

## 2014-09-08 NOTE — Anesthesia Procedure Notes (Signed)
Epidural Patient location during procedure: OB  Staffing Anesthesiologist: Rolen Conger Performed by: anesthesiologist   Preanesthetic Checklist Completed: patient identified, site marked, surgical consent, pre-op evaluation, timeout performed, IV checked, risks and benefits discussed and monitors and equipment checked  Epidural Patient position: sitting Prep: site prepped and draped and DuraPrep Patient monitoring: continuous pulse ox and blood pressure Approach: midline Location: L3-L4 Injection technique: LOR saline  Needle:  Needle type: Tuohy  Needle gauge: 17 G Needle length: 9 cm and 9 Needle insertion depth: 5 cm cm Catheter type: closed end flexible Catheter size: 19 Gauge Catheter at skin depth: 10 cm Test dose: negative  Assessment Events: blood not aspirated, injection not painful, no injection resistance, negative IV test and no paresthesia  Additional Notes Patient identified. Risks/Benefits/Options discussed with patient including but not limited to bleeding, infection, nerve damage, paralysis, failed block, incomplete pain control, headache, blood pressure changes, nausea, vomiting, reactions to medication both or allergic, itching and postpartum back pain. Confirmed with bedside nurse the patient's most recent platelet count. Confirmed with patient that they are not currently taking any anticoagulation, have any bleeding history or any family history of bleeding disorders. Patient expressed understanding and wished to proceed. All questions were answered. Sterile technique was used throughout the entire procedure. Please see nursing notes for vital signs. Test dose was given through epidural catheter and negative prior to continuing to dose epidural or start infusion. Warning signs of high block given to the patient including shortness of breath, tingling/numbness in hands, complete motor block, or any concerning symptoms with instructions to call for help. Patient was  given instructions on fall risk and not to get out of bed. All questions and concerns addressed with instructions to call with any issues or inadequate analgesia.      

## 2014-09-08 NOTE — Anesthesia Preprocedure Evaluation (Signed)
Anesthesia Evaluation  Patient identified by MRN, date of birth, ID band Patient awake    Reviewed: Allergy & Precautions, NPO status , Patient's Chart, lab work & pertinent test results  History of Anesthesia Complications Negative for: history of anesthetic complications  Airway Mallampati: II  TM Distance: >3 FB Neck ROM: Full    Dental no notable dental hx. (+) Dental Advisory Given   Pulmonary neg pulmonary ROS,  breath sounds clear to auscultation  Pulmonary exam normal       Cardiovascular negative cardio ROS Normal cardiovascular examRhythm:Regular Rate:Normal  Hx of palpitations in early pregnancy, seen by a cardiologist that per patient report had her wear a holter monitor with no evidence of irregular rhythms and then these palpitations resolved and no treatment was needed   Neuro/Psych  Headaches, negative psych ROS   GI/Hepatic negative GI ROS, Neg liver ROS,   Endo/Other  negative endocrine ROS  Renal/GU negative Renal ROS  negative genitourinary   Musculoskeletal negative musculoskeletal ROS (+)   Abdominal   Peds negative pediatric ROS (+)  Hematology  (+) anemia ,   Anesthesia Other Findings   Reproductive/Obstetrics (+) Pregnancy                             Anesthesia Physical Anesthesia Plan  ASA: II  Anesthesia Plan: Epidural   Post-op Pain Management:    Induction:   Airway Management Planned:   Additional Equipment:   Intra-op Plan:   Post-operative Plan:   Informed Consent: I have reviewed the patients History and Physical, chart, labs and discussed the procedure including the risks, benefits and alternatives for the proposed anesthesia with the patient or authorized representative who has indicated his/her understanding and acceptance.   Dental advisory given  Plan Discussed with:   Anesthesia Plan Comments:         Anesthesia Quick  Evaluation

## 2014-09-08 NOTE — Progress Notes (Signed)
Amanda PeaMichelle Fan is a 34 y.o. [phone] at [id number]   Subjective: Pt comfortable s/p epidural.  Objective: BP 127/83 mmHg  Pulse 89  Temp(Src) 98 F (36.7 C) (Oral)  Resp 18  Ht 5\' 2"  (1.575 m)  Wt 79.379 kg (175 lb)  BMI 32.00 kg/m2  SpO2 98%      FHT:  FHR: 120s bpm, variability: moderate,  accelerations:  Present,  decelerations:  Absent UC:   regular, every 2 minutes  SVE:   Dilation: 5.5 Effacement (%): 70 Station: -2 Exam by:: Trai Ells  Labs: Lab Results  Component Value Date   WBC 8.3 09/08/2014   HGB 12.4 09/08/2014   HCT 36.4 09/08/2014   MCV 86.1 09/08/2014   PLT 245 09/08/2014    Assessment / Plan: IUP at 39 0/7 weeks  Labor: Progressing normally Preeclampsia:  No s/sxs. Fetal Wellbeing:  Category I Pain Control:  Epidural I/D:  n/a Anticipated MOD:  NSVD  Annastasia Haskins 09/08/2014, 2:00 PM

## 2014-09-09 LAB — CBC
HCT: 31.3 % — ABNORMAL LOW (ref 36.0–46.0)
Hemoglobin: 10.7 g/dL — ABNORMAL LOW (ref 12.0–15.0)
MCH: 29.4 pg (ref 26.0–34.0)
MCHC: 34.2 g/dL (ref 30.0–36.0)
MCV: 86 fL (ref 78.0–100.0)
Platelets: 206 10*3/uL (ref 150–400)
RBC: 3.64 MIL/uL — ABNORMAL LOW (ref 3.87–5.11)
RDW: 13.2 % (ref 11.5–15.5)
WBC: 9.9 10*3/uL (ref 4.0–10.5)

## 2014-09-09 LAB — RPR: RPR Ser Ql: NONREACTIVE

## 2014-09-09 NOTE — Progress Notes (Signed)
Postpartum day #1, NSVD  Subjective Pt without complaints.  Lochia normal.  Pain controlled with Ibuprofen.  Breast feeding no. Outpatient circumcision with [facility]-recommended. Husband to have vasectomy.  Abstinence until then per pt.  Temp:  [97.5 F (36.4 C)-98.4 F (36.9 C)] 98.4 F (36.9 C) (05/13 0005) Pulse Rate:  [78-137] 100 (05/13 0005) Resp:  [18-20] 18 (05/13 0005) BP: (110-143)/(52-87) 117/61 mmHg (05/13 0005) SpO2:  [99 %-100 %] 99 % (05/13 0005)  Gen:  NAD, A&O x 3 Uterine fundus:  Firm, nontender Lochia normal Ext:  +Edema, no calf tenderness bilaterally  CBC    Component Value Date/Time   WBC 9.9 09/09/2014 0550   RBC 3.64* 09/09/2014 0550   HGB 10.7* 09/09/2014 0550   HCT 31.3* 09/09/2014 0550   PLT 206 09/09/2014 0550   MCV 86.0 09/09/2014 0550   MCH 29.4 09/09/2014 0550   MCHC 34.2 09/09/2014 0550   RDW 13.2 09/09/2014 0550     A/P: S/p SVD doing well. Routine postpartum care. Lactation support. Discharge in am. Outpatient circumcision. F/u in 6 weeks.  Geryl RankinsVARNADO, Jacqueline Salazar 09/09/2014, 1:42 PM

## 2014-09-09 NOTE — Anesthesia Postprocedure Evaluation (Signed)
  Anesthesia Post-op Note  Patient: Jacqueline Salazar  Procedure(s) Performed: * No procedures listed *  Patient Location: Mother/Baby  Anesthesia Type:Epidural  Level of Consciousness: awake  Airway and Oxygen Therapy: Patient Spontanous Breathing  Post-op Pain: mild  Post-op Assessment: Patient's Cardiovascular Status Stable and Respiratory Function Stable  Post-op Vital Signs: stable  Last Vitals:  Filed Vitals:   09/09/14 0005  BP: 117/61  Pulse: 100  Temp: 36.9 C  Resp: 18    Complications: No apparent anesthesia complications

## 2014-09-10 MED ORDER — IBUPROFEN 600 MG PO TABS: 600.0000 mg | ORAL_TABLET | Freq: Four times a day (QID) | ORAL | Status: AC | PRN

## 2014-09-10 NOTE — Discharge Instructions (Signed)

## 2014-09-10 NOTE — Discharge Summary (Signed)
  Vaginal Delivery Discharge Summary  Jacqueline Salazar  DOB:    08/24/1980 MRN:    [id number] CSN:    [id number]  Date of admission:                  09/08/14  Date of discharge:                   09/10/14  Procedures this admission:   SVB  Date of Delivery: 09/08/14  Newborn Data:  Live born female  Birth Weight: 6 lb 11.4 oz (3045 g) APGAR: 8, 9  Home with mother. Circumcision Plan: Outpatient  History of Present Illness:  Ms. Jacqueline Salazar is a 34 y.o. female, [id number], who presents at 1446w0d weeks gestation. The patient has been followed by Dr[facility] Idamae [facility]Varnardo [facility]. She was admitted for elective induction of labor due to distance from hospital and husband's work location. Her pregnancy has been complicated by:  Patient Active Problem List   Diagnosis Date Noted  . Vaginal delivery 09/10/2014  . Normal pregnancy 09/08/2014  . Palpitations 05/19/2014     Hospital Course:  Admitted 09/08/14 for elective induction. Positive GBS. Progressed with pitocin. Utilized epidural for pain management.  Delivery was performed by Dr. Dion BodyVarnado without complication. Patient and baby tolerated the procedure without difficulty, with no laceration noted. Infant status was stable and remained in room with mother.  Mother and infant then had an uncomplicated postpartum course, with bottle feeding going well. Mom's physical exam was WNL, and she was discharged home in stable condition. Contraception plan was vasectomy.  She received adequate benefit from po pain medications, using Motrin only.   Feeding:  bottle  Contraception:  vasectomy  Hemoglobin Results:  CBC Latest Ref Rng 09/09/2014 09/08/2014 12/26/2010  WBC 4.0 - 10.5 K/uL 9.9 8.3 12.7(H)  Hemoglobin 12.0 - 15.0 g/dL 10.7(L) 12.4 12.0  Hematocrit 36.0 - 46.0 % 31.3(L) 36.4 36.3  Platelets 150 - 400 K/uL 206 245 220     Discharge Physical Exam:   General: alert Lochia: appropriate Uterine Fundus: firm Incision: NA DVT  Evaluation: No evidence of DVT seen on physical exam. Negative Homan's sign.  Intrapartum Procedures: spontaneous vaginal delivery Postpartum Procedures: none Complications-Operative and Postpartum: none  Discharge Diagnoses: Term Pregnancy-delivered, GBS positive  Discharge Information:  Activity:           pelvic rest Diet:                routine Medications: Ibuprofen Condition:      stable Instructions:     Discharge to: home  Follow-up Information    [facility]. Schedule an appointment as soon as possible for a visit in 2 weeks.   Specialty:  Obstetrics and Gynecology   Why:  Call for any concerns or questions.   Contact information:   [address] [place] [address] [phone]        Nigel BridgemanLATHAM, Johnta Couts CNM 09/10/2014 9:05 AM

## 2016-12-12 ENCOUNTER — Other Ambulatory Visit (HOSPITAL_COMMUNITY)
Admission: RE | Admit: 2016-12-12 | Discharge: 2016-12-12 | Disposition: A | Discharge status: Home / Self Care | Payer: BLUE CROSS/BLUE SHIELD | Source: Ambulatory Visit | Attending: Obstetrics and Gynecology | Admitting: Obstetrics and Gynecology

## 2016-12-12 ENCOUNTER — Other Ambulatory Visit: Payer: Self-pay | Admitting: Obstetrics and Gynecology

## 2016-12-12 DIAGNOSIS — Z124 Encounter for screening for malignant neoplasm of cervix: Secondary | ICD-10-CM | POA: Diagnosis not present

## 2016-12-13 LAB — CYTOLOGY - PAP
Diagnosis: NEGATIVE
HPV: NOT DETECTED

## 2017-02-20 DIAGNOSIS — Z23 Encounter for immunization: Secondary | ICD-10-CM | POA: Diagnosis not present

## 2017-12-22 ENCOUNTER — Other Ambulatory Visit: Payer: Self-pay | Admitting: Obstetrics and Gynecology

## 2017-12-22 DIAGNOSIS — N644 Mastodynia: Secondary | ICD-10-CM

## 2017-12-24 ENCOUNTER — Other Ambulatory Visit: Payer: Self-pay | Admitting: Obstetrics and Gynecology

## 2017-12-24 ENCOUNTER — Ambulatory Visit
Admission: RE | Admit: 2017-12-24 | Discharge: 2017-12-24 | Disposition: A | Discharge status: Home / Self Care | Payer: BLUE CROSS/BLUE SHIELD | Source: Ambulatory Visit | Attending: Obstetrics and Gynecology | Admitting: Obstetrics and Gynecology

## 2017-12-24 DIAGNOSIS — N644 Mastodynia: Secondary | ICD-10-CM

## 2018-06-23 DIAGNOSIS — Z3202 Encounter for pregnancy test, result negative: Secondary | ICD-10-CM | POA: Diagnosis not present

## 2018-06-23 DIAGNOSIS — N764 Abscess of vulva: Secondary | ICD-10-CM | POA: Diagnosis not present

## 2018-06-24 ENCOUNTER — Other Ambulatory Visit: Payer: Self-pay | Admitting: Obstetrics and Gynecology

## 2018-06-24 DIAGNOSIS — N907 Vulvar cyst: Secondary | ICD-10-CM | POA: Diagnosis not present

## 2018-07-15 DIAGNOSIS — N920 Excessive and frequent menstruation with regular cycle: Secondary | ICD-10-CM | POA: Diagnosis not present

## 2018-07-15 DIAGNOSIS — Z309 Encounter for contraceptive management, unspecified: Secondary | ICD-10-CM | POA: Diagnosis not present

## 2018-08-09 DIAGNOSIS — M545 Low back pain: Secondary | ICD-10-CM | POA: Diagnosis not present

## 2018-08-09 DIAGNOSIS — R252 Cramp and spasm: Secondary | ICD-10-CM | POA: Diagnosis not present

## 2018-08-20 DIAGNOSIS — M545 Low back pain: Secondary | ICD-10-CM | POA: Diagnosis not present

## 2018-08-28 DIAGNOSIS — M545 Low back pain: Secondary | ICD-10-CM | POA: Diagnosis not present

## 2018-09-11 DIAGNOSIS — M545 Low back pain: Secondary | ICD-10-CM | POA: Diagnosis not present

## 2018-09-16 DIAGNOSIS — M545 Low back pain: Secondary | ICD-10-CM | POA: Diagnosis not present

## 2018-09-29 DIAGNOSIS — M545 Low back pain: Secondary | ICD-10-CM | POA: Diagnosis not present

## 2018-10-01 DIAGNOSIS — M545 Low back pain: Secondary | ICD-10-CM | POA: Diagnosis not present

## 2018-10-05 DIAGNOSIS — M545 Low back pain: Secondary | ICD-10-CM | POA: Diagnosis not present

## 2018-10-06 DIAGNOSIS — M545 Low back pain: Secondary | ICD-10-CM | POA: Diagnosis not present

## 2018-10-19 DIAGNOSIS — M546 Pain in thoracic spine: Secondary | ICD-10-CM | POA: Diagnosis not present

## 2018-10-20 DIAGNOSIS — M545 Low back pain: Secondary | ICD-10-CM | POA: Diagnosis not present

## 2018-10-23 DIAGNOSIS — R4586 Emotional lability: Secondary | ICD-10-CM | POA: Diagnosis not present

## 2018-10-23 DIAGNOSIS — R635 Abnormal weight gain: Secondary | ICD-10-CM | POA: Diagnosis not present

## 2018-10-23 DIAGNOSIS — N921 Excessive and frequent menstruation with irregular cycle: Secondary | ICD-10-CM | POA: Diagnosis not present

## 2018-10-23 DIAGNOSIS — R5383 Other fatigue: Secondary | ICD-10-CM | POA: Diagnosis not present

## 2018-10-27 DIAGNOSIS — M545 Low back pain: Secondary | ICD-10-CM | POA: Diagnosis not present

## 2018-11-11 DIAGNOSIS — M545 Low back pain: Secondary | ICD-10-CM | POA: Diagnosis not present

## 2018-11-18 DIAGNOSIS — M545 Low back pain: Secondary | ICD-10-CM | POA: Diagnosis not present

## 2019-05-14 ENCOUNTER — Other Ambulatory Visit: Payer: Self-pay | Admitting: Obstetrics and Gynecology

## 2019-05-14 DIAGNOSIS — N6310 Unspecified lump in the right breast, unspecified quadrant: Secondary | ICD-10-CM

## 2019-05-27 ENCOUNTER — Other Ambulatory Visit: Payer: Self-pay

## 2019-05-27 ENCOUNTER — Ambulatory Visit
Admission: RE | Admit: 2019-05-27 | Discharge: 2019-05-27 | Disposition: A | Discharge status: Home / Self Care | Payer: BC Managed Care – PPO | Source: Ambulatory Visit | Attending: Obstetrics and Gynecology | Admitting: Obstetrics and Gynecology

## 2019-05-27 ENCOUNTER — Ambulatory Visit
Admission: RE | Admit: 2019-05-27 | Discharge: 2019-05-27 | Disposition: A | Discharge status: Home / Self Care | Payer: BLUE CROSS/BLUE SHIELD | Source: Ambulatory Visit | Attending: Obstetrics and Gynecology | Admitting: Obstetrics and Gynecology

## 2019-05-27 ENCOUNTER — Other Ambulatory Visit: Payer: Self-pay | Admitting: Obstetrics and Gynecology

## 2019-05-27 DIAGNOSIS — N6310 Unspecified lump in the right breast, unspecified quadrant: Secondary | ICD-10-CM

## 2019-08-27 ENCOUNTER — Other Ambulatory Visit: Payer: BC Managed Care – PPO

## 2019-10-26 ENCOUNTER — Other Ambulatory Visit: Payer: BC Managed Care – PPO

## 2020-07-25 ENCOUNTER — Other Ambulatory Visit: Payer: Self-pay | Admitting: Nurse Practitioner

## 2020-07-25 DIAGNOSIS — N6310 Unspecified lump in the right breast, unspecified quadrant: Secondary | ICD-10-CM

## 2020-08-28 ENCOUNTER — Ambulatory Visit
Admission: RE | Admit: 2020-08-28 | Discharge: 2020-08-28 | Disposition: A | Discharge status: Home / Self Care | Payer: BC Managed Care – PPO | Source: Ambulatory Visit | Attending: Nurse Practitioner | Admitting: Nurse Practitioner

## 2020-08-28 ENCOUNTER — Other Ambulatory Visit: Payer: Self-pay

## 2020-08-28 DIAGNOSIS — N6310 Unspecified lump in the right breast, unspecified quadrant: Secondary | ICD-10-CM

## 2021-11-16 ENCOUNTER — Other Ambulatory Visit: Payer: Self-pay | Admitting: Nurse Practitioner

## 2021-11-16 DIAGNOSIS — Z1231 Encounter for screening mammogram for malignant neoplasm of breast: Secondary | ICD-10-CM

## 2021-11-23 ENCOUNTER — Ambulatory Visit
Admission: RE | Admit: 2021-11-23 | Discharge: 2021-11-23 | Disposition: A | Discharge status: Home / Self Care | Payer: BC Managed Care – PPO | Source: Ambulatory Visit | Attending: Nurse Practitioner | Admitting: Nurse Practitioner

## 2021-11-23 DIAGNOSIS — Z1231 Encounter for screening mammogram for malignant neoplasm of breast: Secondary | ICD-10-CM

## 2021-11-27 ENCOUNTER — Other Ambulatory Visit: Payer: Self-pay | Admitting: Nurse Practitioner

## 2021-11-27 DIAGNOSIS — R921 Mammographic calcification found on diagnostic imaging of breast: Secondary | ICD-10-CM

## 2021-11-30 ENCOUNTER — Other Ambulatory Visit: Payer: Self-pay | Admitting: Nurse Practitioner

## 2021-11-30 ENCOUNTER — Ambulatory Visit
Admission: RE | Admit: 2021-11-30 | Discharge: 2021-11-30 | Disposition: A | Discharge status: Home / Self Care | Payer: BC Managed Care – PPO | Source: Ambulatory Visit | Attending: Nurse Practitioner | Admitting: Nurse Practitioner

## 2021-11-30 DIAGNOSIS — R921 Mammographic calcification found on diagnostic imaging of breast: Secondary | ICD-10-CM

## 2021-12-05 ENCOUNTER — Other Ambulatory Visit: Payer: Self-pay | Admitting: Nurse Practitioner

## 2021-12-05 DIAGNOSIS — R921 Mammographic calcification found on diagnostic imaging of breast: Secondary | ICD-10-CM

## 2021-12-10 ENCOUNTER — Ambulatory Visit
Admission: RE | Admit: 2021-12-10 | Discharge: 2021-12-10 | Disposition: A | Discharge status: Home / Self Care | Payer: BC Managed Care – PPO | Source: Ambulatory Visit | Attending: Nurse Practitioner | Admitting: Nurse Practitioner

## 2021-12-10 DIAGNOSIS — R921 Mammographic calcification found on diagnostic imaging of breast: Secondary | ICD-10-CM

## 2022-06-07 ENCOUNTER — Other Ambulatory Visit: Payer: Self-pay | Admitting: General Surgery

## 2022-06-07 DIAGNOSIS — N6489 Other specified disorders of breast: Secondary | ICD-10-CM

## 2022-07-02 ENCOUNTER — Ambulatory Visit
Admission: RE | Admit: 2022-07-02 | Discharge: 2022-07-02 | Disposition: A | Discharge status: Home / Self Care | Payer: BC Managed Care – PPO | Source: Ambulatory Visit | Attending: General Surgery | Admitting: General Surgery

## 2022-07-02 ENCOUNTER — Other Ambulatory Visit: Payer: Self-pay | Admitting: General Surgery

## 2022-07-02 DIAGNOSIS — N6489 Other specified disorders of breast: Secondary | ICD-10-CM

## 2023-02-17 ENCOUNTER — Other Ambulatory Visit: Payer: Self-pay | Admitting: General Surgery

## 2023-02-17 ENCOUNTER — Ambulatory Visit
Admission: RE | Admit: 2023-02-17 | Discharge: 2023-02-17 | Disposition: A | Discharge status: Home / Self Care | Payer: No Typology Code available for payment source | Source: Ambulatory Visit | Attending: General Surgery | Admitting: General Surgery

## 2023-02-17 DIAGNOSIS — N6489 Other specified disorders of breast: Secondary | ICD-10-CM

## 2023-08-19 ENCOUNTER — Ambulatory Visit
Admission: RE | Admit: 2023-08-19 | Discharge: 2023-08-19 | Disposition: A | Discharge status: Home / Self Care | Source: Ambulatory Visit | Attending: General Surgery | Admitting: General Surgery

## 2023-08-19 DIAGNOSIS — N6489 Other specified disorders of breast: Secondary | ICD-10-CM

## 2023-08-20 ENCOUNTER — Other Ambulatory Visit: Payer: Self-pay | Admitting: General Surgery

## 2023-08-20 DIAGNOSIS — N6489 Other specified disorders of breast: Secondary | ICD-10-CM

## 2024-04-05 ENCOUNTER — Encounter

## 2024-05-06 ENCOUNTER — Encounter

## 2024-05-11 ENCOUNTER — Ambulatory Visit
Admission: RE | Admit: 2024-05-11 | Discharge: 2024-05-11 | Disposition: A | Source: Ambulatory Visit | Attending: General Surgery | Admitting: General Surgery

## 2024-05-11 DIAGNOSIS — N6489 Other specified disorders of breast: Secondary | ICD-10-CM
